# Patient Record
Sex: Female | Born: 2000 | Race: White | Hispanic: No | Marital: Single | Smoking: Never smoker
Health system: Southern US, Community
[De-identification: ages and names within clinical notes are randomized; demographics above are authoritative.]

---
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
## 2019-02-05 NOTE — L&D Delivery Note (Addendum)
Delivery Note Called to bedside, patient complete and pushing. At 1:33 AM a viable female was delivered via Vaginal, Spontaneous (Presentation: Left Occiput Anterior).  Tight nuchal cord noted, delivered through. APGAR: 8, 9; weight 7 lb 11.5 oz (3500 g).   Placenta status: Spontaneous, Intact.  Cord: 3 vessels with the following complications: None.  Anesthesia: None Episiotomy: None Lacerations: 2nd degree;Perineal Suture Repair: 2.0 vicryl Est. Blood Loss (mL): 300  Mom to postpartum.  Baby to Couplet care / Skin to Skin.  Alric Seton 10/19/2019, 3:22 AM

## 2019-03-10 ENCOUNTER — Ambulatory Visit (INDEPENDENT_AMBULATORY_CARE_PROVIDER_SITE_OTHER): Payer: Medicaid Other | Admitting: Adult Health

## 2019-03-10 ENCOUNTER — Other Ambulatory Visit: Payer: Self-pay

## 2019-03-10 ENCOUNTER — Encounter: Payer: Self-pay | Admitting: Adult Health

## 2019-03-10 VITALS — BP 121/69 | HR 87 | Ht 65.5 in | Wt 127.0 lb

## 2019-03-10 DIAGNOSIS — O3680X0 Pregnancy with inconclusive fetal viability, not applicable or unspecified: Secondary | ICD-10-CM | POA: Diagnosis not present

## 2019-03-10 DIAGNOSIS — Z3A01 Less than 8 weeks gestation of pregnancy: Secondary | ICD-10-CM

## 2019-03-10 DIAGNOSIS — Z3201 Encounter for pregnancy test, result positive: Secondary | ICD-10-CM | POA: Diagnosis not present

## 2019-03-10 LAB — POCT URINE PREGNANCY: Preg Test, Ur: POSITIVE — AB

## 2019-03-10 MED ORDER — PRENATAL GUMMIES/DHA & FA 0.4-32.5 MG PO CHEW: 2.0000 | CHEWABLE_TABLET | Freq: Every day | ORAL | Status: DC

## 2019-03-10 NOTE — Progress Notes (Signed)
  Subjective:     Patient ID: Colleen Welch, female   DOB: 30-Jan-2001, 19 y.o.   MRN: [id number]  HPI  Colleen Welch is a 19 year old white female, single in for UPT.Has missed a period and had 3+HPTs.   Review of Systems +missed period, with 3 +HPTs Has had dry heaves in am, esp with brushing her teeth  Reviewed past medical,surgical, social and family history. Reviewed medications and allergies.     Objective:   Physical Exam BP 121/69 (BP Location: Left Arm, Patient Position: Sitting, Cuff Size: Normal)   Pulse 87   Ht 5' 5.5" (1.664 m)   Wt 127 lb (57.6 kg)   LMP 01/27/2019 (Exact Date)   BMI 20.81 kg/m UPT +, about 6 weeks by LMP with EDD 11/03/19. Skin warm and dry. Neck: mid line trachea, normal thyroid, good ROM, no lymphadenopathy noted. Lungs: clear to ausculation bilaterally. Cardiovascular: regular rate and rhythm. Abdomen is soft and non tender.  Fall risk is low PHQ 2 score is 0.     Assessment:     1. Positive urine pregnancy test Eat often   2. Less than [redacted] weeks gestation of pregnancy Continue PN Gummies   3. Encounter to determine fetal viability of pregnancy, single or unspecified fetus Dating [place] in 2 weeks     Plan:     Review handout on First Trimester and by [facility]

## 2019-03-10 NOTE — Patient Instructions (Signed)
First Trimester of Pregnancy The first trimester of pregnancy is from week 1 until the end of week 13 (months 1 through 3). A week after a sperm fertilizes an egg, the egg will implant on the wall of the uterus. This embryo will begin to develop into a baby. Genes from you and your partner will form the baby. The female genes will determine whether the baby will be a boy or a girl. At 6-8 weeks, the eyes and face will be formed, and the heartbeat can be seen on ultrasound. At the end of 12 weeks, all the baby's organs will be formed. Now that you are pregnant, you will want to do everything you can to have a healthy baby. Two of the most important things are to get good prenatal care and to follow your health care provider's instructions. Prenatal care is all the medical care you receive before the baby's birth. This care will help prevent, find, and treat any problems during the pregnancy and childbirth. Body changes during your first trimester Your body goes through many changes during pregnancy. The changes vary from woman to woman.  You may gain or lose a couple of pounds at first.  You may feel sick to your stomach (nauseous) and you may throw up (vomit). If the vomiting is uncontrollable, call your health care provider.  You may tire easily.  You may develop headaches that can be relieved by medicines. All medicines should be approved by your health care provider.  You may urinate more often. Painful urination may mean you have a bladder infection.  You may develop heartburn as a result of your pregnancy.  You may develop constipation because certain hormones are causing the muscles that push stool through your intestines to slow down.  You may develop hemorrhoids or swollen veins (varicose veins).  Your breasts may begin to grow larger and become tender. Your nipples may stick out more, and the tissue that surrounds them (areola) may become darker.  Your gums may bleed and may be  sensitive to brushing and flossing.  Dark spots or blotches (chloasma, mask of pregnancy) may develop on your face. This will likely fade after the baby is born.  Your menstrual periods will stop.  You may have a loss of appetite.  You may develop cravings for certain kinds of food.  You may have changes in your emotions from day to day, such as being excited to be pregnant or being concerned that something may go wrong with the pregnancy and baby.  You may have more vivid and strange dreams.  You may have changes in your hair. These can include thickening of your hair, rapid growth, and changes in texture. Some women also have hair loss during or after pregnancy, or hair that feels dry or thin. Your hair will most likely return to normal after your baby is born. What to expect at prenatal visits During a routine prenatal visit:  You will be weighed to make sure you and the baby are growing normally.  Your blood pressure will be taken.  Your abdomen will be measured to track your baby's growth.  The fetal heartbeat will be listened to between weeks 10 and 14 of your pregnancy.  Test results from any previous visits will be discussed. Your health care provider may ask you:  How you are feeling.  If you are feeling the baby move.  If you have had any abnormal symptoms, such as leaking fluid, bleeding, severe headaches, or abdominal   cramping.  If you are using any tobacco products, including cigarettes, chewing tobacco, and electronic cigarettes.  If you have any questions. Other tests that may be performed during your first trimester include:  Blood tests to find your blood type and to check for the presence of any previous infections. The tests will also be used to check for low iron levels (anemia) and protein on red blood cells (Rh antibodies). Depending on your risk factors, or if you previously had diabetes during pregnancy, you may have tests to check for high blood sugar  that affects pregnant women (gestational diabetes).  Urine tests to check for infections, diabetes, or protein in the urine.  An ultrasound to confirm the proper growth and development of the baby.  Fetal screens for spinal cord problems (spina bifida) and Down syndrome.  HIV (human immunodeficiency virus) testing. Routine prenatal testing includes screening for HIV, unless you choose not to have this test.  You may need other tests to make sure you and the baby are doing well. Follow these instructions at home: Medicines  Follow your health care provider's instructions regarding medicine use. Specific medicines may be either safe or unsafe to take during pregnancy.  Take a prenatal vitamin that contains at least 600 micrograms (mcg) of folic acid.  If you develop constipation, try taking a stool softener if your health care provider approves. Eating and drinking   Eat a balanced diet that includes fresh fruits and vegetables, whole grains, good sources of protein such as meat, eggs, or tofu, and low-fat dairy. Your health care provider will help you determine the amount of weight gain that is right for you.  Avoid raw meat and uncooked cheese. These carry germs that can cause birth defects in the baby.  Eating four or five small meals rather than three large meals a day may help relieve nausea and vomiting. If you start to feel nauseous, eating a few soda crackers can be helpful. Drinking liquids between meals, instead of during meals, also seems to help ease nausea and vomiting.  Limit foods that are high in fat and processed sugars, such as fried and sweet foods.  To prevent constipation: ? Eat foods that are high in fiber, such as fresh fruits and vegetables, whole grains, and beans. ? Drink enough fluid to keep your urine clear or pale yellow. Activity  Exercise only as directed by your health care provider. Most women can continue their usual exercise routine during  pregnancy. Try to exercise for 30 minutes at least 5 days a week. Exercising will help you: ? Control your weight. ? Stay in shape. ? Be prepared for labor and delivery.  Experiencing pain or cramping in the lower abdomen or lower back is a good sign that you should stop exercising. Check with your health care provider before continuing with normal exercises.  Try to avoid standing for long periods of time. Move your legs often if you must stand in one place for a long time.  Avoid heavy lifting.  Wear low-heeled shoes and practice good posture.  You may continue to have sex unless your health care provider tells you not to. Relieving pain and discomfort  Wear a good support bra to relieve breast tenderness.  Take warm sitz baths to soothe any pain or discomfort caused by hemorrhoids. Use hemorrhoid cream if your health care provider approves.  Rest with your legs elevated if you have leg cramps or low back pain.  If you develop varicose veins in   your legs, wear support hose. Elevate your feet for 15 minutes, 3-4 times a day. Limit salt in your diet. Prenatal care  Schedule your prenatal visits by the twelfth week of pregnancy. They are usually scheduled monthly at first, then more often in the last 2 months before delivery.  Write down your questions. Take them to your prenatal visits.  Keep all your prenatal visits as told by your health care provider. This is important. Safety  Wear your seat belt at all times when driving.  Make a list of emergency phone numbers, including numbers for family, friends, the hospital, and police and fire departments. General instructions  Ask your health care provider for a referral to a local prenatal education class. Begin classes no later than the beginning of month 6 of your pregnancy.  Ask for help if you have counseling or nutritional needs during pregnancy. Your health care provider can offer advice or refer you to specialists for help  with various needs.  Do not use hot tubs, steam rooms, or saunas.  Do not douche or use tampons or scented sanitary pads.  Do not cross your legs for long periods of time.  Avoid cat litter boxes and soil used by cats. These carry germs that can cause birth defects in the baby and possibly loss of the fetus by miscarriage or stillbirth.  Avoid all smoking, herbs, alcohol, and medicines not prescribed by your health care provider. Chemicals in these products affect the formation and growth of the baby.  Do not use any products that contain nicotine or tobacco, such as cigarettes and e-cigarettes. If you need help quitting, ask your health care provider. You may receive counseling support and other resources to help you quit.  Schedule a dentist appointment. At home, brush your teeth with a soft toothbrush and be gentle when you floss. Contact a health care provider if:  You have dizziness.  You have mild pelvic cramps, pelvic pressure, or nagging pain in the abdominal area.  You have persistent nausea, vomiting, or diarrhea.  You have a bad smelling vaginal discharge.  You have pain when you urinate.  You notice increased swelling in your face, hands, legs, or ankles.  You are exposed to fifth disease or chickenpox.  You are exposed to German measles (rubella) and have never had it. Get help right away if:  You have a fever.  You are leaking fluid from your vagina.  You have spotting or bleeding from your vagina.  You have severe abdominal cramping or pain.  You have rapid weight gain or loss.  You vomit blood or material that looks like coffee grounds.  You develop a severe headache.  You have shortness of breath.  You have any kind of trauma, such as from a fall or a car accident. Summary  The first trimester of pregnancy is from week 1 until the end of week 13 (months 1 through 3).  Your body goes through many changes during pregnancy. The changes vary from  woman to woman.  You will have routine prenatal visits. During those visits, your health care provider will examine you, discuss any test results you may have, and talk with you about how you are feeling. This information is not intended to replace advice given to you by your health care provider. Make sure you discuss any questions you have with your health care provider. Document Revised: 01/03/2017 Document Reviewed: 01/03/2016 Elsevier Patient Education  2020 [employer]  

## 2019-03-24 ENCOUNTER — Other Ambulatory Visit: Payer: Self-pay

## 2019-03-24 ENCOUNTER — Ambulatory Visit (INDEPENDENT_AMBULATORY_CARE_PROVIDER_SITE_OTHER): Payer: Medicaid Other

## 2019-03-24 DIAGNOSIS — Z3A08 8 weeks gestation of pregnancy: Secondary | ICD-10-CM | POA: Diagnosis not present

## 2019-03-24 DIAGNOSIS — O3680X0 Pregnancy with inconclusive fetal viability, not applicable or unspecified: Secondary | ICD-10-CM | POA: Diagnosis not present

## 2019-03-24 NOTE — Progress Notes (Signed)
[place] 8 wks single IUP with YS,positive fht 169 bpm,normal ovaries,crl 19.83 mm

## 2019-04-20 ENCOUNTER — Other Ambulatory Visit: Payer: Self-pay | Admitting: Obstetrics & Gynecology

## 2019-04-20 DIAGNOSIS — Z3682 Encounter for antenatal screening for nuchal translucency: Secondary | ICD-10-CM

## 2019-04-21 ENCOUNTER — Encounter: Payer: Self-pay | Admitting: Women's Health

## 2019-04-21 ENCOUNTER — Ambulatory Visit (INDEPENDENT_AMBULATORY_CARE_PROVIDER_SITE_OTHER): Payer: Medicaid Other | Admitting: Women's Health

## 2019-04-21 ENCOUNTER — Ambulatory Visit (INDEPENDENT_AMBULATORY_CARE_PROVIDER_SITE_OTHER): Payer: Medicaid Other

## 2019-04-21 ENCOUNTER — Other Ambulatory Visit: Payer: Self-pay

## 2019-04-21 ENCOUNTER — Ambulatory Visit: Payer: Medicaid Other | Admitting: *Deleted

## 2019-04-21 VITALS — BP 123/69 | Wt 141.6 lb

## 2019-04-21 DIAGNOSIS — Z3400 Encounter for supervision of normal first pregnancy, unspecified trimester: Secondary | ICD-10-CM | POA: Insufficient documentation

## 2019-04-21 DIAGNOSIS — Z3A12 12 weeks gestation of pregnancy: Secondary | ICD-10-CM

## 2019-04-21 DIAGNOSIS — Z3401 Encounter for supervision of normal first pregnancy, first trimester: Secondary | ICD-10-CM | POA: Diagnosis not present

## 2019-04-21 DIAGNOSIS — Z331 Pregnant state, incidental: Secondary | ICD-10-CM

## 2019-04-21 DIAGNOSIS — Z3682 Encounter for antenatal screening for nuchal translucency: Secondary | ICD-10-CM | POA: Diagnosis not present

## 2019-04-21 DIAGNOSIS — Z1389 Encounter for screening for other disorder: Secondary | ICD-10-CM

## 2019-04-21 DIAGNOSIS — Z3402 Encounter for supervision of normal first pregnancy, second trimester: Secondary | ICD-10-CM

## 2019-04-21 DIAGNOSIS — Z363 Encounter for antenatal screening for malformations: Secondary | ICD-10-CM

## 2019-04-21 LAB — POCT URINALYSIS DIPSTICK OB
Blood, UA: NEGATIVE
Glucose, UA: NEGATIVE
Ketones, UA: NEGATIVE
Leukocytes, UA: NEGATIVE
Nitrite, UA: NEGATIVE
POC,PROTEIN,UA: NEGATIVE

## 2019-04-21 MED ORDER — BLOOD PRESSURE MONITOR MISC: 0 refills | Status: DC

## 2019-04-21 MED ORDER — DOXYLAMINE-PYRIDOXINE 10-10 MG PO TBEC: DELAYED_RELEASE_TABLET | ORAL | 6 refills | Status: DC

## 2019-04-21 NOTE — Progress Notes (Signed)
[place] 12 wks,measurements c/w dates,crl 66.1 mm,FHR 145 bpm,NB present,NT 2.1 mm

## 2019-04-21 NOTE — Progress Notes (Signed)
INITIAL OBSTETRICAL VISIT Patient name: Colleen Welch MRN [id number]  Date of birth: [birth year] Chief Complaint:   Initial Prenatal Visit  History of Present Illness:   Colleen Welch is a 19 y.o. G1P0 Caucasian female at [redacted]w[redacted]d by LMP c/w 8wk u/s, with an Estimated Date of Delivery: 11/03/19 being seen today for her initial obstetrical visit.   Her obstetrical history is significant for primigravida.   Today she reports nausea, vomited x 2, wants meds.  Patient's last menstrual period was 01/27/2019 (exact date). Last pap <21yo. Results were: n/a Review of Systems:   Pertinent items are noted in HPI Denies cramping/contractions, leakage of fluid, vaginal bleeding, abnormal vaginal discharge w/ itching/odor/irritation, headaches, visual changes, shortness of breath, chest pain, abdominal pain, severe nausea/vomiting, or problems with urination or bowel movements unless otherwise stated above.  Pertinent History Reviewed:  Reviewed past medical,surgical, social, obstetrical and family history.  Reviewed problem list, medications and allergies. OB History  Gravida Para Term Preterm AB Living  1            SAB TAB Ectopic Multiple Live Births               # Outcome Date GA Lbr Len/2nd Weight Sex Delivery Anes PTL Lv  1 Current            Physical Assessment:   Vitals:   04/21/19 1512  BP: 123/69  Weight: 141 lb 9.6 oz (64.2 kg)  Body mass index is 23.21 kg/m.       Physical Examination:  General appearance - well appearing, and in no distress  Mental status - alert, oriented to person, place, and time  Psych:  She has a normal mood and affect  Skin - warm and dry, normal color, no suspicious lesions noted  Chest - effort normal, all lung fields clear to auscultation bilaterally  Heart - normal rate and regular rhythm  Abdomen - soft, nontender  Extremities:  No swelling or varicosities noted  Thin prep pap is not done  TODAY'S NT [place] 12 wks,measurements c/w dates,crl 66.1  mm,FHR 145 bpm,NB present,NT 2.1 mm,normal ovaries   Results for orders placed or performed in visit on 04/21/19 (from the past 24 hour(s))  POC Urinalysis Dipstick OB   Collection Time: 04/21/19  3:06 PM  Result Value Ref Range   Color, UA     Clarity, UA     Glucose, UA Negative Negative   Bilirubin, UA     Ketones, UA n    Spec Grav, UA     Blood, UA n    pH, UA     POC,PROTEIN,UA Negative Negative, Trace, Small (1+), Moderate (2+), Large (3+), 4+   Urobilinogen, UA     Nitrite, UA n    Leukocytes, UA Negative Negative   Appearance     Odor      Assessment & Plan:  1) Low-Risk Pregnancy G1P0 at 102w0d with an Estimated Date of Delivery: 11/03/19   2) Initial OB visit  3) N/V> rx diclegis, gave printed prevention/relief measures   Meds:  Meds ordered this encounter  Medications  . Blood Pressure Monitor MISC    Sig: For regular home bp monitoring during pregnancy    Dispense:  1 each    Refill:  0    Z34.00  . Doxylamine-Pyridoxine (DICLEGIS) 10-10 MG TBEC    Sig: 2 tabs q hs, if sx persist add 1 tab q am on day 3, if sx persist add  1 tab q afternoon on day 4    Dispense:  100 tablet    Refill:  6    Order Specific Question:   Supervising Provider    Answer:   Tania Ade H [[id number]]    Initial labs obtained Continue prenatal vitamins Reviewed n/v relief measures and warning s/s to report Reviewed recommended weight gain based on pre-gravid BMI Encouraged well-balanced diet Genetic Screening discussed: requested nt/it, maternit21 Cystic fibrosis, SMA, Fragile X screening discussed requested Ultrasound discussed; fetal survey: requested CCNC completed>PCM not here, form faxed The nature of [place] for [employer] with multiple MDs and other Advanced Practice Providers was explained to patient; also emphasized that fellows, residents, and students are part of our team. Does not have home bp cuff. Rx faxed to [facility]. Check bp weekly, let us know if  >140/90.   Follow-up: Return in about 6 weeks (around 06/02/2019) for LROB, 2nd IT, [id number], in person, CNM.   Orders Placed This Encounter  Procedures  . Urine Culture  . GC/Chlamydia Probe Amp  . US OB Comp + 14 Wk  . Pain Management Screening Profile (10S)  . Hepatitis C antibody  . Obstetric Panel, Including HIV  . Hgb Fractionation Cascade  . Integrated 1  . POC Urinalysis Dipstick OB    Roma Schanz CNM, [facility] 04/21/2019 4:09 PM

## 2019-04-21 NOTE — Patient Instructions (Signed)
[name], I greatly value your feedback.  If you receive a survey following your visit with [place] today, we appreciate you taking the time to fill it out.  Thanks, Joellyn Haff, CNM, [facility]  [facility] HAS MOVED!!! It is now [facility] ([address] [place], [place] [address]) Entrance located off of [address] 24/7 valet parking   Nausea & Vomiting  Have saltine crackers or pretzels by your bed and eat a few bites before you raise your head out of bed in the morning  Eat small frequent meals throughout the day instead of large meals  Drink plenty of fluids throughout the day to stay hydrated, just don't drink a lot of fluids with your meals.  This can make your stomach fill up faster making you feel sick  Do not brush your teeth right after you eat  Products with real ginger are good for nausea, like ginger ale and ginger hard candy Make sure it says made with real ginger!  Sucking on sour candy like lemon heads is also good for nausea  If your prenatal vitamins make you nauseated, take them at night so you will sleep through the nausea  Sea Bands  If you feel like you need medicine for the nausea & vomiting please let us know  If you are unable to keep any fluids or food down please let us know   Constipation  Drink plenty of fluid, preferably water, throughout the day  Eat foods high in fiber such as fruits, vegetables, and grains  Exercise, such as walking, is a good way to keep your bowels regular  Drink warm fluids, especially warm prune juice, or decaf coffee  Eat a 1/2 cup of real oatmeal (not instant), 1/2 cup applesauce, and 1/2-1 cup warm prune juice every day  If needed, you may take Colace (docusate sodium) stool softener once or twice a day to help keep the stool soft.   If you still are having problems with constipation, you may take Miralax once daily as needed to help keep your bowels regular.   Home Blood  Pressure Monitoring for Patients   Your provider has recommended that you check your blood pressure (BP) at least once a week at home. If you do not have a blood pressure cuff at home, one will be provided for you. Contact your provider if you have not received your monitor within 1 week.   Helpful Tips for Accurate Home Blood Pressure Checks  . Don't smoke, exercise, or drink caffeine 30 minutes before checking your BP . Use the restroom before checking your BP (a full bladder can raise your pressure) . Relax in a comfortable upright chair . Feet on the ground . Left arm resting comfortably on a flat surface at the level of your heart . Legs uncrossed . Back supported . Sit quietly and don't talk . Place the cuff on your bare arm . Adjust snuggly, so that only two fingertips can fit between your skin and the top of the cuff . Check 2 readings separated by at least one minute . Keep a log of your BP readings . For a visual, please reference this diagram: http://ccnc.care/bpdiagram  Provider Name: [facility]     Phone: [phone]  Zone 1: ALL CLEAR  Continue to monitor your symptoms:  . BP reading is less than 140 (top number) or less than 90 (bottom number)  . No right upper stomach  pain . No headaches or seeing spots . No feeling nauseated or throwing up . No swelling in face and hands  Zone 2: CAUTION Call your doctor's office for any of the following:  . BP reading is greater than 140 (top number) or greater than 90 (bottom number)  . Stomach pain under your ribs in the middle or right side . Headaches or seeing spots . Feeling nauseated or throwing up . Swelling in face and hands  Zone 3: EMERGENCY  Seek immediate medical care if you have any of the following:  . BP reading is greater than160 (top number) or greater than 110 (bottom number) . Severe headaches not improving with Tylenol . Serious difficulty catching your breath . Any worsening symptoms from Zone  2    First Trimester of Pregnancy The first trimester of pregnancy is from week 1 until the end of week 12 (months 1 through 3). A week after a sperm fertilizes an egg, the egg will implant on the wall of the uterus. This embryo will begin to develop into a baby. Genes from you and your partner are forming the baby. The female genes determine whether the baby is a boy or a girl. At 6-8 weeks, the eyes and face are formed, and the heartbeat can be seen on ultrasound. At the end of 12 weeks, all the baby's organs are formed.  Now that you are pregnant, you will want to do everything you can to have a healthy baby. Two of the most important things are to get good prenatal care and to follow your health care provider's instructions. Prenatal care is all the medical care you receive before the baby's birth. This care will help prevent, find, and treat any problems during the pregnancy and childbirth. BODY CHANGES Your body goes through many changes during pregnancy. The changes vary from woman to woman.   You may gain or lose a couple of pounds at first.  You may feel sick to your stomach (nauseous) and throw up (vomit). If the vomiting is uncontrollable, call your health care provider.  You may tire easily.  You may develop headaches that can be relieved by medicines approved by your health care provider.  You may urinate more often. Painful urination may mean you have a bladder infection.  You may develop heartburn as a result of your pregnancy.  You may develop constipation because certain hormones are causing the muscles that push waste through your intestines to slow down.  You may develop hemorrhoids or swollen, bulging veins (varicose veins).  Your breasts may begin to grow larger and become tender. Your nipples may stick out more, and the tissue that surrounds them (areola) may become darker.  Your gums may bleed and may be sensitive to brushing and flossing.  Dark spots or blotches  (chloasma, mask of pregnancy) may develop on your face. This will likely fade after the baby is born.  Your menstrual periods will stop.  You may have a loss of appetite.  You may develop cravings for certain kinds of food.  You may have changes in your emotions from day to day, such as being excited to be pregnant or being concerned that something may go wrong with the pregnancy and baby.  You may have more vivid and strange dreams.  You may have changes in your hair. These can include thickening of your hair, rapid growth, and changes in texture. Some women also have hair loss during or after pregnancy, or hair that  feels dry or thin. Your hair will most likely return to normal after your baby is born. WHAT TO EXPECT AT YOUR PRENATAL VISITS During a routine prenatal visit:  You will be weighed to make sure you and the baby are growing normally.  Your blood pressure will be taken.  Your abdomen will be measured to track your baby's growth.  The fetal heartbeat will be listened to starting around week 10 or 12 of your pregnancy.  Test results from any previous visits will be discussed. Your health care provider may ask you:  How you are feeling.  If you are feeling the baby move.  If you have had any abnormal symptoms, such as leaking fluid, bleeding, severe headaches, or abdominal cramping.  If you have any questions. Other tests that may be performed during your first trimester include:  Blood tests to find your blood type and to check for the presence of any previous infections. They will also be used to check for low iron levels (anemia) and Rh antibodies. Later in the pregnancy, blood tests for diabetes will be done along with other tests if problems develop.  Urine tests to check for infections, diabetes, or protein in the urine.  An ultrasound to confirm the proper growth and development of the baby.  An amniocentesis to check for possible genetic problems.  Fetal  screens for spina bifida and Down syndrome.  You may need other tests to make sure you and the baby are doing well. HOME CARE INSTRUCTIONS  Medicines  Follow your health care provider's instructions regarding medicine use. Specific medicines may be either safe or unsafe to take during pregnancy.  Take your prenatal vitamins as directed.  If you develop constipation, try taking a stool softener if your health care provider approves. Diet  Eat regular, well-balanced meals. Choose a variety of foods, such as meat or vegetable-based protein, fish, milk and low-fat dairy products, vegetables, fruits, and whole grain breads and cereals. Your health care provider will help you determine the amount of weight gain that is right for you.  Avoid raw meat and uncooked cheese. These carry germs that can cause birth defects in the baby.  Eating four or five small meals rather than three large meals a day may help relieve nausea and vomiting. If you start to feel nauseous, eating a few soda crackers can be helpful. Drinking liquids between meals instead of during meals also seems to help nausea and vomiting.  If you develop constipation, eat more high-fiber foods, such as fresh vegetables or fruit and whole grains. Drink enough fluids to keep your urine clear or pale yellow. Activity and Exercise  Exercise only as directed by your health care provider. Exercising will help you:  Control your weight.  Stay in shape.  Be prepared for labor and delivery.  Experiencing pain or cramping in the lower abdomen or low back is a good sign that you should stop exercising. Check with your health care provider before continuing normal exercises.  Try to avoid standing for long periods of time. Move your legs often if you must stand in one place for a long time.  Avoid heavy lifting.  Wear low-heeled shoes, and practice good posture.  You may continue to have sex unless your health care provider directs you  otherwise. Relief of Pain or Discomfort  Wear a good support bra for breast tenderness.    Take warm sitz baths to soothe any pain or discomfort caused by hemorrhoids. Use hemorrhoid cream  if your health care provider approves.    Rest with your legs elevated if you have leg cramps or low back pain.  If you develop varicose veins in your legs, wear support hose. Elevate your feet for 15 minutes, 3-4 times a day. Limit salt in your diet. Prenatal Care  Schedule your prenatal visits by the twelfth week of pregnancy. They are usually scheduled monthly at first, then more often in the last 2 months before delivery.  Write down your questions. Take them to your prenatal visits.  Keep all your prenatal visits as directed by your health care provider. Safety  Wear your seat belt at all times when driving.  Make a list of emergency phone numbers, including numbers for family, friends, the hospital, and police and fire departments. General Tips  Ask your health care provider for a referral to a local prenatal education class. Begin classes no later than at the beginning of month 6 of your pregnancy.  Ask for help if you have counseling or nutritional needs during pregnancy. Your health care provider can offer advice or refer you to specialists for help with various needs.  Do not use hot tubs, steam rooms, or saunas.  Do not douche or use tampons or scented sanitary pads.  Do not cross your legs for long periods of time.  Avoid cat litter boxes and soil used by cats. These carry germs that can cause birth defects in the baby and possibly loss of the fetus by miscarriage or stillbirth.  Avoid all smoking, herbs, alcohol, and medicines not prescribed by your health care provider. Chemicals in these affect the formation and growth of the baby.  Schedule a dentist appointment. At home, brush your teeth with a soft toothbrush and be gentle when you floss. SEEK MEDICAL CARE IF:   You have  dizziness.  You have mild pelvic cramps, pelvic pressure, or nagging pain in the abdominal area.  You have persistent nausea, vomiting, or diarrhea.  You have a bad smelling vaginal discharge.  You have pain with urination.  You notice increased swelling in your face, hands, legs, or ankles. SEEK IMMEDIATE MEDICAL CARE IF:   You have a fever.  You are leaking fluid from your vagina.  You have spotting or bleeding from your vagina.  You have severe abdominal cramping or pain.  You have rapid weight gain or loss.  You vomit blood or material that looks like coffee grounds.  You are exposed to [place] measles and have never had them.  You are exposed to fifth disease or chickenpox.  You develop a severe headache.  You have shortness of breath.  You have any kind of trauma, such as from a fall or a car accident. Document Released: 01/15/2001 Document Revised: 06/07/2013 Document Reviewed: 12/01/2012 [facility] Patient Information 2015 [place], [place]. This information is not intended to replace advice given to you by your health care provider. Make sure you discuss any questions you have with your health care provider.  Coronavirus (COVID-19) Are you at risk?  Are you at risk for the Coronavirus (COVID-19)?  To be considered HIGH RISK for Coronavirus (COVID-19), you have to meet the following criteria:  . Traveled to [place], [place], [place], [place] or [place]; or in the [place] to [place], [place], [place], or [place]; and have fever, cough, and shortness of breath within the last 2 weeks of travel OR . Been in close contact with a person diagnosed with COVID-19 within the last 2  weeks and have fever, cough, and shortness of breath . IF YOU DO NOT MEET THESE CRITERIA, YOU ARE CONSIDERED LOW RISK FOR COVID-19.  What to do if you are HIGH RISK for COVID-19?  Marland Kitchen If you are having a medical emergency, call 911. . Seek medical care right away. Before you go to a  doctor's office, urgent care or emergency department, call ahead and tell them about your recent travel, contact with someone diagnosed with COVID-19, and your symptoms. You should receive instructions from your physician's office regarding next steps of care.  . When you arrive at healthcare provider, tell the healthcare staff immediately you have returned from visiting [place], [place], [place], [place] or [place]; or traveled in the [place] to [place], [place], [place], or [place]; in the last two weeks or you have been in close contact with a person diagnosed with COVID-19 in the last 2 weeks.   . Tell the health care staff about your symptoms: fever, cough and shortness of breath. . After you have been seen by a medical provider, you will be either: o Tested for (COVID-19) and discharged home on quarantine except to seek medical care if symptoms worsen, and asked to  - Stay home and avoid contact with others until you get your results (4-5 days)  - Avoid travel on public transportation if possible (such as bus, train, or airplane) or o Sent to the Emergency Department by EMS for evaluation, COVID-19 testing, and possible admission depending on your condition and test results.  What to do if you are LOW RISK for COVID-19?  Reduce your risk of any infection by using the same precautions used for avoiding the common cold or flu:  Marland Kitchen Wash your hands often with soap and warm water for at least 20 seconds.  If soap and water are not readily available, use an alcohol-based hand sanitizer with at least 60% alcohol.  . If coughing or sneezing, cover your mouth and nose by coughing or sneezing into the elbow areas of your shirt or coat, into a tissue or into your sleeve (not your hands). . Avoid shaking hands with others and consider head nods or verbal greetings only. . Avoid touching your eyes, nose, or mouth with unwashed hands.  . Avoid close contact with people who are sick. . Avoid  places or events with large numbers of people in one location, like concerts or sporting events. . Carefully consider travel plans you have or are making. . If you are planning any travel outside or inside the [place], visit the CDC's Travelers' Health webpage for the latest health notices. . If you have some symptoms but not all symptoms, continue to monitor at home and seek medical attention if your symptoms worsen. . If you are having a medical emergency, call 911.   [place] / e-Visit: [email or web address]         [facility]: [facility]: [id number]                   [facility]: [phone]

## 2019-04-22 LAB — PMP SCREEN PROFILE (10S), URINE
Amphetamine Scrn, Ur: NEGATIVE ng/mL
BARBITURATE SCREEN URINE: NEGATIVE ng/mL
BENZODIAZEPINE SCREEN, URINE: NEGATIVE ng/mL
CANNABINOIDS UR QL SCN: POSITIVE ng/mL — AB
Cocaine (Metab) Scrn, Ur: NEGATIVE ng/mL
Creatinine(Crt), U: 74.5 mg/dL (ref 20.0–300.0)
Methadone Screen, Urine: NEGATIVE ng/mL
OXYCODONE+OXYMORPHONE UR QL SCN: NEGATIVE ng/mL
Opiate Scrn, Ur: NEGATIVE ng/mL
Ph of Urine: 6.1 (ref 4.5–8.9)
Phencyclidine Qn, Ur: NEGATIVE ng/mL
Propoxyphene Scrn, Ur: NEGATIVE ng/mL

## 2019-04-23 ENCOUNTER — Encounter: Payer: Self-pay | Admitting: Women's Health

## 2019-04-23 DIAGNOSIS — Z283 Underimmunization status: Secondary | ICD-10-CM | POA: Insufficient documentation

## 2019-04-23 DIAGNOSIS — O09899 Supervision of other high risk pregnancies, unspecified trimester: Secondary | ICD-10-CM | POA: Insufficient documentation

## 2019-04-23 DIAGNOSIS — F1290 Cannabis use, unspecified, uncomplicated: Secondary | ICD-10-CM | POA: Insufficient documentation

## 2019-04-23 LAB — INTEGRATED 1
Crown Rump Length: 66 mm
Gest. Age on Collection Date: 12.7 weeks
Maternal Age at EDD: 20.6 yr
Nuchal Translucency (NT): 2.1 mm
Number of Fetuses: 1
PAPP-A Value: 955.5 ng/mL
Weight: 142 [lb_av]

## 2019-04-23 LAB — URINE CULTURE

## 2019-04-24 LAB — HEPATITIS C ANTIBODY: Hep C Virus Ab: <0.1 s/co ratio (ref 0.0–0.9)

## 2019-04-24 LAB — HGB FRACTIONATION CASCADE
Hgb A2: 2.9 % (ref 1.8–3.2)
Hgb A: 96.7 % (ref 96.4–98.8)
Hgb F: 0.4 % (ref 0.0–2.0)
Hgb S: 0 %

## 2019-04-24 LAB — OBSTETRIC PANEL, INCLUDING HIV
Antibody Screen: NEGATIVE
Basophils Absolute: 0.1 10*3/uL (ref 0.0–0.2)
Basos: 1 %
EOS (ABSOLUTE): 0.2 10*3/uL (ref 0.0–0.4)
Eos: 2 %
HIV Screen 4th Generation wRfx: NONREACTIVE
Hematocrit: 35.5 % (ref 34.0–46.6)
Hemoglobin: 12.1 g/dL (ref 11.1–15.9)
Hepatitis B Surface Ag: NEGATIVE
Immature Grans (Abs): 0 10*3/uL (ref 0.0–0.1)
Immature Granulocytes: 0 %
Lymphocytes Absolute: 2.9 10*3/uL (ref 0.7–3.1)
Lymphs: 21 %
MCH: 30.2 pg (ref 26.6–33.0)
MCHC: 34.1 g/dL (ref 31.5–35.7)
MCV: 89 fL (ref 79–97)
Monocytes Absolute: 0.7 10*3/uL (ref 0.1–0.9)
Monocytes: 5 %
Neutrophils Absolute: 10.1 10*3/uL — ABNORMAL HIGH (ref 1.4–7.0)
Neutrophils: 71 %
Platelets: 360 10*3/uL (ref 150–450)
RBC: 4.01 x10E6/uL (ref 3.77–5.28)
RDW: 12.2 % (ref 11.7–15.4)
RPR Ser Ql: NONREACTIVE
Rh Factor: NEGATIVE
Rubella Antibodies, IGG: <0.9 index — ABNORMAL LOW (ref >0.99)
WBC: 14 10*3/uL — ABNORMAL HIGH (ref 3.4–10.8)

## 2019-04-24 LAB — GC/CHLAMYDIA PROBE AMP
Chlamydia trachomatis, NAA: NEGATIVE
Neisseria Gonorrhoeae by PCR: NEGATIVE

## 2019-05-06 ENCOUNTER — Other Ambulatory Visit: Payer: Self-pay | Admitting: *Deleted

## 2019-05-06 DIAGNOSIS — Z3A14 14 weeks gestation of pregnancy: Secondary | ICD-10-CM

## 2019-05-10 LAB — MATERNIT 21 PLUS CORE, BLOOD
Fetal Fraction: 9
Result (T21): NEGATIVE
Trisomy 13 (Patau syndrome): NEGATIVE
Trisomy 18 (Edwards syndrome): NEGATIVE
Trisomy 21 (Down syndrome): NEGATIVE

## 2019-06-02 ENCOUNTER — Encounter: Payer: Self-pay | Admitting: Advanced Practice Midwife

## 2019-06-02 ENCOUNTER — Ambulatory Visit (INDEPENDENT_AMBULATORY_CARE_PROVIDER_SITE_OTHER): Payer: Medicaid Other

## 2019-06-02 ENCOUNTER — Ambulatory Visit (INDEPENDENT_AMBULATORY_CARE_PROVIDER_SITE_OTHER): Payer: Medicaid Other | Admitting: Advanced Practice Midwife

## 2019-06-02 ENCOUNTER — Other Ambulatory Visit: Payer: Self-pay

## 2019-06-02 VITALS — BP 125/74 | HR 84 | Wt 145.0 lb

## 2019-06-02 DIAGNOSIS — Z1389 Encounter for screening for other disorder: Secondary | ICD-10-CM

## 2019-06-02 DIAGNOSIS — Z363 Encounter for antenatal screening for malformations: Secondary | ICD-10-CM | POA: Diagnosis not present

## 2019-06-02 DIAGNOSIS — Z3401 Encounter for supervision of normal first pregnancy, first trimester: Secondary | ICD-10-CM

## 2019-06-02 DIAGNOSIS — Z1379 Encounter for other screening for genetic and chromosomal anomalies: Secondary | ICD-10-CM

## 2019-06-02 DIAGNOSIS — Z3A18 18 weeks gestation of pregnancy: Secondary | ICD-10-CM

## 2019-06-02 DIAGNOSIS — O321XX0 Maternal care for breech presentation, not applicable or unspecified: Secondary | ICD-10-CM

## 2019-06-02 DIAGNOSIS — Z331 Pregnant state, incidental: Secondary | ICD-10-CM

## 2019-06-02 DIAGNOSIS — Z3402 Encounter for supervision of normal first pregnancy, second trimester: Secondary | ICD-10-CM

## 2019-06-02 DIAGNOSIS — O26899 Other specified pregnancy related conditions, unspecified trimester: Secondary | ICD-10-CM | POA: Insufficient documentation

## 2019-06-02 DIAGNOSIS — Z6791 Unspecified blood type, Rh negative: Secondary | ICD-10-CM

## 2019-06-02 LAB — POCT URINALYSIS DIPSTICK OB
Glucose, UA: NEGATIVE
Ketones, UA: NEGATIVE
Leukocytes, UA: NEGATIVE
Nitrite, UA: NEGATIVE

## 2019-06-02 NOTE — Patient Instructions (Signed)
[name], I greatly value your feedback.  If you receive a survey following your visit with [place] today, we appreciate you taking the time to fill it out.  Thanks, Philipp Deputy, CNM  [facility] ([address] [place], [place] [address]) Entrance C, located off of [address] valet parking  Go to [email or web address] to register for FREE online childbirth classes  [place] Pediatricians/Family Doctors:  [facility] [phone]            [facility] [phone]                 [facility] [phone] (usually not accepting new patients unless you have family there already, you are always welcome to call and ask)       [facility] Department [phone]       [facility] Pediatricians/Family Doctors:   [facility]: [phone]  [facility]: [phone]  [facility]: [phone]  [facility] Doctors:   [facility]: [phone]   [facility]: [phone]  [facility] Doctors:  [facility]: [phone]    Home Blood Pressure Monitoring for Patients   Your provider has recommended that you check your blood pressure (BP) at least once a week at home. If you do not have a blood pressure cuff at home, one will be provided for you. Contact your provider if you have not received your monitor within 1 week.   Helpful Tips for Accurate Home Blood Pressure Checks  . Don't smoke, exercise, or drink caffeine 30 minutes before checking your BP . Use the restroom before checking your BP (a full bladder can raise your pressure) . Relax in a comfortable upright chair . Feet on the ground . Left arm resting comfortably on a flat surface at the level of your heart . Legs uncrossed . Back supported . Sit quietly and don't talk . Place the cuff on your bare arm . Adjust snuggly, so that only  two fingertips can fit between your skin and the top of the cuff . Check 2 readings separated by at least one minute . Keep a log of your BP readings . For a visual, please reference this diagram: http://ccnc.care/bpdiagram  Provider Name: [facility]     Phone: [phone]  Zone 1: ALL CLEAR  Continue to monitor your symptoms:  . BP reading is less than 140 (top number) or less than 90 (bottom number)  . No right upper stomach pain . No headaches or seeing spots . No feeling nauseated or throwing up . No swelling in face and hands  Zone 2: CAUTION Call your doctor's office for any of the following:  . BP reading is greater than 140 (top number) or greater than 90 (bottom number)  . Stomach pain under your ribs in the middle or right side . Headaches or seeing spots . Feeling nauseated or throwing up . Swelling in face and hands  Zone 3: EMERGENCY  Seek immediate medical care if you have any of the following:  . BP reading is greater than160 (top number) or greater than 110 (bottom number) . Severe headaches not improving with Tylenol . Serious difficulty catching your breath . Any worsening symptoms from Zone 2     Second Trimester of Pregnancy The second trimester is from week 14 through week 27 (months 4 through 6). The second trimester is often a time when you feel your best.  Your body has adjusted to being pregnant, and you begin to feel better physically. Usually, morning sickness has lessened or quit completely, you may have more energy, and you may have an increase in appetite. The second trimester is also a time when the fetus is growing rapidly. At the end of the sixth month, the fetus is about 9 inches long and weighs about 1 pounds. You will likely begin to feel the baby move (quickening) between 16 and 20 weeks of pregnancy. Body changes during your second trimester Your body continues to go through many changes during your second trimester. The changes vary  from woman to woman.  Your weight will continue to increase. You will notice your lower abdomen bulging out.  You may begin to get stretch marks on your hips, abdomen, and breasts.  You may develop headaches that can be relieved by medicines. The medicines should be approved by your health care provider.  You may urinate more often because the fetus is pressing on your bladder.  You may develop or continue to have heartburn as a result of your pregnancy.  You may develop constipation because certain hormones are causing the muscles that push waste through your intestines to slow down.  You may develop hemorrhoids or swollen, bulging veins (varicose veins).  You may have back pain. This is caused by: ? Weight gain. ? Pregnancy hormones that are relaxing the joints in your pelvis. ? A shift in weight and the muscles that support your balance.  Your breasts will continue to grow and they will continue to become tender.  Your gums may bleed and may be sensitive to brushing and flossing.  Dark spots or blotches (chloasma, mask of pregnancy) may develop on your face. This will likely fade after the baby is born.  A dark line from your belly button to the pubic area (linea nigra) may appear. This will likely fade after the baby is born.  You may have changes in your hair. These can include thickening of your hair, rapid growth, and changes in texture. Some women also have hair loss during or after pregnancy, or hair that feels dry or thin. Your hair will most likely return to normal after your baby is born.  What to expect at prenatal visits During a routine prenatal visit:  You will be weighed to make sure you and the fetus are growing normally.  Your blood pressure will be taken.  Your abdomen will be measured to track your baby's growth.  The fetal heartbeat will be listened to.  Any test results from the previous visit will be discussed.  Your health care provider may ask  you:  How you are feeling.  If you are feeling the baby move.  If you have had any abnormal symptoms, such as leaking fluid, bleeding, severe headaches, or abdominal cramping.  If you are using any tobacco products, including cigarettes, chewing tobacco, and electronic cigarettes.  If you have any questions.  Other tests that may be performed during your second trimester include:  Blood tests that check for: ? Low iron levels (anemia). ? High blood sugar that affects pregnant women (gestational diabetes) between 14 and 28 weeks. ? Rh antibodies. This is to check for a protein on red blood cells (Rh factor).  Urine tests to check for infections, diabetes, or protein in the urine.  An ultrasound to confirm the proper growth and development of the baby.  An amniocentesis to check for possible genetic problems.  Fetal screens  for spina bifida and Down syndrome.  HIV (human immunodeficiency virus) testing. Routine prenatal testing includes screening for HIV, unless you choose not to have this test.  Follow these instructions at home: Medicines  Follow your health care provider's instructions regarding medicine use. Specific medicines may be either safe or unsafe to take during pregnancy.  Take a prenatal vitamin that contains at least 600 micrograms (mcg) of folic acid.  If you develop constipation, try taking a stool softener if your health care provider approves. Eating and drinking  Eat a balanced diet that includes fresh fruits and vegetables, whole grains, good sources of protein such as meat, eggs, or tofu, and low-fat dairy. Your health care provider will help you determine the amount of weight gain that is right for you.  Avoid raw meat and uncooked cheese. These carry germs that can cause birth defects in the baby.  If you have low calcium intake from food, talk to your health care provider about whether you should take a daily calcium supplement.  Limit foods that  are high in fat and processed sugars, such as fried and sweet foods.  To prevent constipation: ? Drink enough fluid to keep your urine clear or pale yellow. ? Eat foods that are high in fiber, such as fresh fruits and vegetables, whole grains, and beans. Activity  Exercise only as directed by your health care provider. Most women can continue their usual exercise routine during pregnancy. Try to exercise for 30 minutes at least 5 days a week. Stop exercising if you experience uterine contractions.  Avoid heavy lifting, wear low heel shoes, and practice good posture.  A sexual relationship may be continued unless your health care provider directs you otherwise. Relieving pain and discomfort  Wear a good support bra to prevent discomfort from breast tenderness.  Take warm sitz baths to soothe any pain or discomfort caused by hemorrhoids. Use hemorrhoid cream if your health care provider approves.  Rest with your legs elevated if you have leg cramps or low back pain.  If you develop varicose veins, wear support hose. Elevate your feet for 15 minutes, 3-4 times a day. Limit salt in your diet. Prenatal Care  Write down your questions. Take them to your prenatal visits.  Keep all your prenatal visits as told by your health care provider. This is important. Safety  Wear your seat belt at all times when driving.  Make a list of emergency phone numbers, including numbers for family, friends, the hospital, and police and fire departments. General instructions  Ask your health care provider for a referral to a local prenatal education class. Begin classes no later than the beginning of month 6 of your pregnancy.  Ask for help if you have counseling or nutritional needs during pregnancy. Your health care provider can offer advice or refer you to specialists for help with various needs.  Do not use hot tubs, steam rooms, or saunas.  Do not douche or use tampons or scented sanitary  pads.  Do not cross your legs for long periods of time.  Avoid cat litter boxes and soil used by cats. These carry germs that can cause birth defects in the baby and possibly loss of the fetus by miscarriage or stillbirth.  Avoid all smoking, herbs, alcohol, and unprescribed drugs. Chemicals in these products can affect the formation and growth of the baby.  Do not use any products that contain nicotine or tobacco, such as cigarettes and e-cigarettes. If you need help quitting,  ask your health care provider.  Visit your dentist if you have not gone yet during your pregnancy. Use a soft toothbrush to brush your teeth and be gentle when you floss. Contact a health care provider if:  You have dizziness.  You have mild pelvic cramps, pelvic pressure, or nagging pain in the abdominal area.  You have persistent nausea, vomiting, or diarrhea.  You have a bad smelling vaginal discharge.  You have pain when you urinate. Get help right away if:  You have a fever.  You are leaking fluid from your vagina.  You have spotting or bleeding from your vagina.  You have severe abdominal cramping or pain.  You have rapid weight gain or weight loss.  You have shortness of breath with chest pain.  You notice sudden or extreme swelling of your face, hands, ankles, feet, or legs.  You have not felt your baby move in over an hour.  You have severe headaches that do not go away when you take medicine.  You have vision changes. Summary  The second trimester is from week 14 through week 27 (months 4 through 6). It is also a time when the fetus is growing rapidly.  Your body goes through many changes during pregnancy. The changes vary from woman to woman.  Avoid all smoking, herbs, alcohol, and unprescribed drugs. These chemicals affect the formation and growth your baby.  Do not use any tobacco products, such as cigarettes, chewing tobacco, and e-cigarettes. If you need help quitting, ask your  health care provider.  Contact your health care provider if you have any questions. Keep all prenatal visits as told by your health care provider. This is important. This information is not intended to replace advice given to you by your health care provider. Make sure you discuss any questions you have with your health care provider. Document Released: 01/15/2001 Document Revised: 06/29/2015 Document Reviewed: 03/24/2012 Elsevier Interactive Patient Education  2017 [place] FLU! Because you are pregnant, we at [facility], along with the Centers for Disease Control (CDC), recommend that you receive the flu vaccine to protect yourself and your baby from the flu. The flu is more likely to cause severe illness in pregnant women than in women of reproductive age who are not pregnant. Changes in the immune system, heart, and lungs during pregnancy make pregnant women (and women up to two weeks postpartum) more prone to severe illness from flu, including illness resulting in hospitalization. Flu also may be harmful for a pregnant woman's developing baby. A common flu symptom is fever, which may be associated with neural tube defects and other adverse outcomes for a developing baby. Getting vaccinated can also help protect a baby after birth from flu. (Mom passes antibodies onto the developing baby during her pregnancy.)  A Flu Vaccine is the Best Protection Against Flu Getting a flu vaccine is the first and most important step in protecting against flu. Pregnant women should get a flu shot and not the live attenuated influenza vaccine (LAIV), also known as nasal spray flu vaccine. Flu vaccines given during pregnancy help protect both the mother and her baby from flu. Vaccination has been shown to reduce the risk of flu-associated acute respiratory infection in pregnant women by up to one-half. A 2018 study showed that getting a flu shot reduced a pregnant woman's  risk of being hospitalized with flu by an average of 40 percent. Pregnant women who get a flu  vaccine are also helping to protect their babies from flu illness for the first several months after their birth, when they are too young to get vaccinated.   A Long Record of Safety for Flu Shots in Pregnant Women Flu shots have been given to millions of pregnant women over many years with a good safety record. There is a lot of evidence that flu vaccines can be given safely during pregnancy; though these data are limited for the first trimester. The CDC recommends that pregnant women get vaccinated during any trimester of their pregnancy. It is very important for pregnant women to get the flu shot.   Other Preventive Actions In addition to getting a flu shot, pregnant women should take the same everyday preventive actions the CDC recommends of everyone, including covering coughs, washing hands often, and avoiding people who are sick.  Symptoms and Treatment If you get sick with flu symptoms call your doctor right away. There are antiviral drugs that can treat flu illness and prevent serious flu complications. The CDC recommends prompt treatment for people who have influenza infection or suspected influenza infection and who are at high risk of serious flu complications, such as people with asthma, diabetes (including gestational diabetes), or heart disease. Early treatment of influenza in hospitalized pregnant women has been shown to reduce the length of the hospital stay.  Symptoms Flu symptoms include fever, cough, sore throat, runny or stuffy nose, body aches, headache, chills and fatigue. Some people may also have vomiting and diarrhea. People may be infected with the flu and have respiratory symptoms without a fever.  Early Treatment is Important for Pregnant Women Treatment should begin as soon as possible because antiviral drugs work best when started early (within 48 hours after symptoms  start). Antiviral drugs can make your flu illness milder and make you feel better faster. They may also prevent serious health problems that can result from flu illness. Oral oseltamivir (Tamiflu) is the preferred treatment for pregnant women because it has the most studies available to suggest that it is safe and beneficial. Antiviral drugs require a prescription from your provider. Having a fever caused by flu infection or other infections early in pregnancy may be linked to birth defects in a baby. In addition to taking antiviral drugs, pregnant women who get a fever should treat their fever with Tylenol (acetaminophen) and contact their provider immediately.  When to [place] If you are pregnant and have any of these signs, seek care immediately:  Difficulty breathing or shortness of breath  Pain or pressure in the chest or abdomen  Sudden dizziness  Confusion  Severe or persistent vomiting  High fever that is not responding to Tylenol (or store brand equivalent)  Decreased or no movement of your baby  [employer][email or web address]

## 2019-06-02 NOTE — Progress Notes (Addendum)
   LOW-RISK PREGNANCY VISIT Patient name: Colleen Welch MRN [id number]  Date of birth: 12-09-2000 Chief Complaint:   Routine Prenatal Visit ([place] & 2nd IT)  History of Present Illness:   Colleen Welch is a 19 y.o. G1P0 female at [redacted]w[redacted]d with an Estimated Date of Delivery: 11/03/19 being seen today for ongoing management of a low-risk pregnancy.  Today she reports doing well; denies dysuria/frequency. Contractions: Not present. Vag. Bleeding: None.  Movement: Absent. denies leaking of fluid. Review of Systems:   Pertinent items are noted in HPI Denies abnormal vaginal discharge w/ itching/odor/irritation, headaches, visual changes, shortness of breath, chest pain, abdominal pain, severe nausea/vomiting, or problems with urination or bowel movements unless otherwise stated above. Pertinent History Reviewed:  Reviewed past medical,surgical, social, obstetrical and family history.  Reviewed problem list, medications and allergies. Physical Assessment:   Vitals:   06/02/19 1609  BP: 125/74  Pulse: 84  Weight: 145 lb (65.8 kg)  Body mass index is 23.76 kg/m.        Physical Examination:   General appearance: Well appearing, and in no distress  Mental status: Alert, oriented to person, place, and time  Skin: Warm & dry  Cardiovascular: Normal heart rate noted  Respiratory: Normal respiratory effort, no distress  Abdomen: Soft, gravid, nontender  Pelvic: Cervical exam deferred         Extremities: Edema: None  Fetal Status: Fetal Heart Rate (bpm): 148 u/s   Movement: Absent     Anatomy u/s: [place] 18 wks,breech,posterior placenta gr 0,normal ovaries,cx 3.2 cm,bilat renal pelvic dilatation,RK 4 mm,LK  5.3 mm,fhr 148 bpm,svp of fluid 4.8 cm,EFW 287 g 99%,AC 97%,anatomy complete  Results for orders placed or performed in visit on 06/02/19 (from the past 24 hour(s))  POC Urinalysis Dipstick OB   Collection Time: 06/02/19  4:09 PM  Result Value Ref Range   Color, UA     Clarity, UA     Glucose, UA Negative Negative   Bilirubin, UA     Ketones, UA neg    Spec Grav, UA     Blood, UA trace    pH, UA     POC,PROTEIN,UA Small (1+) Negative, Trace, Small (1+), Moderate (2+), Large (3+), 4+   Urobilinogen, UA     Nitrite, UA neg    Leukocytes, UA Negative Negative   Appearance     Odor      Assessment & Plan:  1) Low-risk pregnancy G1P0 at [redacted]w[redacted]d with an Estimated Date of Delivery: 11/03/19   2) Bilat pyelectasis (4 & 43mm), repeat at 32wks with growth (EFW 99th%); written info given  3) Suspicious urine, no symptoms> to culture  4) Rh neg, needs Rhogam at 28wks  Meds: No orders of the defined types were placed in this encounter.  Labs/procedures today: anatomy U/S, 2nd IT, urine culture  Plan:  Continue routine obstetrical care   Reviewed: Preterm labor symptoms and general obstetric precautions including but not limited to vaginal bleeding, contractions, leaking of fluid and fetal movement were reviewed in detail with the patient.  All questions were answered. Didn't ask about home bp cuff. Check bp weekly, let us know if >140/90.   Follow-up: Return in about 4 weeks (around 06/30/2019) for LROB, in person.  Orders Placed This Encounter  Procedures  . INTEGRATED 2  . POC Urinalysis Dipstick OB   Arabella Merles [facility] 06/02/2019 4:33 PM

## 2019-06-02 NOTE — Progress Notes (Signed)
[place] 18 wks,breech,posterior placenta gr 0,normal ovaries,cx 3.2 cm,bilat renal pelvic dilatation,RK 4 mm,LK  5.3 mm,fhr 148 bpm,svp of fluid 4.8 cm,EFW 287 g 99%,AC 97%,anatomy complete

## 2019-06-04 LAB — INTEGRATED 2
AFP MoM: 1.27
Alpha-Fetoprotein: 60.3 ng/mL
Crown Rump Length: 66 mm
DIA MoM: 2.44
DIA Value: 403.9 pg/mL
Estriol, Unconjugated: 1.8 ng/mL
Gest. Age on Collection Date: 12.7 weeks
Gestational Age: 18.7 weeks
Maternal Age at EDD: 20.6 yr
Nuchal Translucency (NT): 2.1 mm
Nuchal Translucency MoM: 1.42
Number of Fetuses: 1
PAPP-A MoM: 0.9
PAPP-A Value: 955.5 ng/mL
Test Results:: NEGATIVE
Weight: 142 [lb_av]
Weight: 145 [lb_av]
hCG MoM: 1.55
hCG Value: 37 IU/mL
uE3 MoM: 1.07

## 2019-06-04 LAB — URINE CULTURE

## 2019-06-30 ENCOUNTER — Ambulatory Visit (INDEPENDENT_AMBULATORY_CARE_PROVIDER_SITE_OTHER): Payer: Medicaid Other | Admitting: Women's Health

## 2019-06-30 ENCOUNTER — Encounter: Payer: Self-pay | Admitting: Women's Health

## 2019-06-30 VITALS — BP 112/65 | HR 101 | Wt 150.0 lb

## 2019-06-30 DIAGNOSIS — Z3402 Encounter for supervision of normal first pregnancy, second trimester: Secondary | ICD-10-CM

## 2019-06-30 DIAGNOSIS — Z1389 Encounter for screening for other disorder: Secondary | ICD-10-CM

## 2019-06-30 DIAGNOSIS — Z331 Pregnant state, incidental: Secondary | ICD-10-CM

## 2019-06-30 DIAGNOSIS — Z3A22 22 weeks gestation of pregnancy: Secondary | ICD-10-CM

## 2019-06-30 LAB — POCT URINALYSIS DIPSTICK OB
Blood, UA: NEGATIVE
Glucose, UA: NEGATIVE
Ketones, UA: NEGATIVE
Leukocytes, UA: NEGATIVE
Nitrite, UA: NEGATIVE

## 2019-06-30 NOTE — Progress Notes (Signed)
   LOW-RISK PREGNANCY VISIT Patient name: Colleen Welch MRN [id number]  Date of birth: 2000-02-10 Chief Complaint:   Routine Prenatal Visit  History of Present Illness:   Colleen Welch is a 19 y.o. G1P0 female at [redacted]w[redacted]d with an Estimated Date of Delivery: 11/03/19 being seen today for ongoing management of a low-risk pregnancy.  Depression screen PHQ 2/9 03/10/2019  Decreased Interest 0  Down, Depressed, Hopeless 0  PHQ - 2 Score 0    Today she reports no complaints. Contractions: Not present.  .  Movement: Present. denies leaking of fluid. Review of Systems:   Pertinent items are noted in HPI Denies abnormal vaginal discharge w/ itching/odor/irritation, headaches, visual changes, shortness of breath, chest pain, abdominal pain, severe nausea/vomiting, or problems with urination or bowel movements unless otherwise stated above. Pertinent History Reviewed:  Reviewed past medical,surgical, social, obstetrical and family history.  Reviewed problem list, medications and allergies. Physical Assessment:   Vitals:   06/30/19 1108  BP: 112/65  Pulse: (!) 101  Weight: 150 lb (68 kg)  Body mass index is 24.58 kg/m.        Physical Examination:   General appearance: Well appearing, and in no distress  Mental status: Alert, oriented to person, place, and time  Skin: Warm & dry  Cardiovascular: Normal heart rate noted  Respiratory: Normal respiratory effort, no distress  Abdomen: Soft, gravid, nontender  Pelvic: Cervical exam deferred         Extremities: Edema: None  Fetal Status: Fetal Heart Rate (bpm): 156 Fundal Height: 22 cm Movement: Present    Chaperone: n/a    Results for orders placed or performed in visit on 06/30/19 (from the past 24 hour(s))  POC Urinalysis Dipstick OB   Collection Time: 06/30/19 11:13 AM  Result Value Ref Range   Color, UA     Clarity, UA     Glucose, UA Negative Negative   Bilirubin, UA     Ketones, UA neg    Spec Grav, UA     Blood, UA neg    pH, UA     POC,PROTEIN,UA Trace Negative, Trace, Small (1+), Moderate (2+), Large (3+), 4+   Urobilinogen, UA     Nitrite, UA neg    Leukocytes, UA Negative Negative   Appearance     Odor      Assessment & Plan:  1) Low-risk pregnancy G1P0 at [redacted]w[redacted]d with an Estimated Date of Delivery: 11/03/19   2) Bilateral fetal pyelectasis, repeat u/s @ 32wks   Meds: No orders of the defined types were placed in this encounter.  Labs/procedures today: none  Plan:  Continue routine obstetrical care  Next visit: prefers will be in person for pn2    Reviewed: Preterm labor symptoms and general obstetric precautions including but not limited to vaginal bleeding, contractions, leaking of fluid and fetal movement were reviewed in detail with the patient.  All questions were answered. Has home bp cuff.  Check bp weekly, let us know if >140/90.   Follow-up: Return in about 4 weeks (around 07/28/2019) for LROB, PN2, CNM, in person.  Orders Placed This Encounter  Procedures  . POC Urinalysis Dipstick OB   Cheral Marker CNM, [facility] 06/30/2019 11:58 AM

## 2019-06-30 NOTE — Patient Instructions (Signed)
Colleen Welch, I greatly value your feedback.  If you receive a survey following your visit with [place] today, we appreciate you taking the time to fill it out.  Thanks, Colleen Welch, CNM, WHNP-BC   You will have your sugar test next visit.  Please do not eat or drink anything after midnight the night before you come, not even water.  You will be here for at least two hours.  Please make an appointment online for the bloodwork at [email or web address] for 8:30am (or as close to this as possible). Make sure you select the [facility] service center. The day of the appointment, check in with our office first, then you will go to [facility] to start the sugar test.    [facility] [place], [place] [address]) Entrance C, located off of [address] valet parking  Go to [email or web address] to register for FREE online childbirth classes   Call the office [phone]) or go to [facility] if:  You begin to have strong, frequent contractions  Your water breaks.  Sometimes it is a big gush of fluid, sometimes it is just a trickle that keeps getting your panties wet or running down your legs  You have vaginal bleeding.  It is normal to have a small amount of spotting if your cervix was checked.   You don't feel your baby moving like normal.  If you don't, get you something to eat and drink and lay down and focus on feeling your baby move.   If your baby is still not moving like normal, you should call the office or go to [facility].  [place] Pediatricians/Family Doctors:  [facility] [phone]            [place] [facility] [phone]                 [facility] [phone] (usually not accepting new patients unless you have family there already, you are always welcome to call and ask)       [facility] Department [phone]       [facility] Pediatricians/Family Doctors:   [facility]:  [phone]  [facility]: [phone]  [facility]: [phone]  [facility] Doctors:   [facility]: [phone]   [facility]: [phone]  [facility] Doctors:  [facility]: [phone]   Home Blood Pressure Monitoring for Patients   Your provider has recommended that you check your blood pressure (BP) at least once a week at home. If you do not have a blood pressure cuff at home, one will be provided for you. Contact your provider if you have not received your monitor within 1 week.   Helpful Tips for Accurate Home Blood Pressure Checks  . Don't smoke, exercise, or drink caffeine 30 minutes before checking your BP . Use the restroom before checking your BP (a full bladder can raise your pressure) . Relax in a comfortable upright chair . Feet on the ground . Left arm resting comfortably on a flat surface at the level of your heart . Legs uncrossed . Back supported . Sit quietly and don't talk . Place the cuff on your bare arm . Adjust snuggly, so that only two fingertips can fit between your skin and the top of the cuff . Check 2 readings separated by at least one minute . Keep a log of your BP readings . For a visual, please  reference this diagram: http://ccnc.care/bpdiagram  Provider Name: [facility]     Phone: [phone]  Zone 1: ALL CLEAR  Continue to monitor your symptoms:  . BP reading is less than 140 (top number) or less than 90 (bottom number)  . No right upper stomach pain . No headaches or seeing spots . No feeling nauseated or throwing up . No swelling in face and hands  Zone 2: CAUTION Call your doctor's office for any of the following:  . BP reading is greater than 140 (top number) or greater than 90 (bottom number)  . Stomach pain under your ribs in the middle or right side . Headaches or seeing spots . Feeling nauseated or throwing up . Swelling in  face and hands  Zone 3: EMERGENCY  Seek immediate medical care if you have any of the following:  . BP reading is greater than160 (top number) or greater than 110 (bottom number) . Severe headaches not improving with Tylenol . Serious difficulty catching your breath . Any worsening symptoms from Zone 2   Second Trimester of Pregnancy The second trimester is from week 13 through week 28, months 4 through 6. The second trimester is often a time when you feel your best. Your body has also adjusted to being pregnant, and you begin to feel better physically. Usually, morning sickness has lessened or quit completely, you may have more energy, and you may have an increase in appetite. The second trimester is also a time when the fetus is growing rapidly. At the end of the sixth month, the fetus is about 9 inches long and weighs about 1 pounds. You will likely begin to feel the baby move (quickening) between 18 and 20 weeks of the pregnancy. BODY CHANGES Your body goes through many changes during pregnancy. The changes vary from woman to woman.   Your weight will continue to increase. You will notice your lower abdomen bulging out.  You may begin to get stretch marks on your hips, abdomen, and breasts.  You may develop headaches that can be relieved by medicines approved by your health care provider.  You may urinate more often because the fetus is pressing on your bladder.  You may develop or continue to have heartburn as a result of your pregnancy.  You may develop constipation because certain hormones are causing the muscles that push waste through your intestines to slow down.  You may develop hemorrhoids or swollen, bulging veins (varicose veins).  You may have back pain because of the weight gain and pregnancy hormones relaxing your joints between the bones in your pelvis and as a result of a shift in weight and the muscles that support your balance.  Your breasts will continue to grow  and be tender.  Your gums may bleed and may be sensitive to brushing and flossing.  Dark spots or blotches (chloasma, mask of pregnancy) may develop on your face. This will likely fade after the baby is born.  A dark line from your belly button to the pubic area (linea nigra) may appear. This will likely fade after the baby is born.  You may have changes in your hair. These can include thickening of your hair, rapid growth, and changes in texture. Some women also have hair loss during or after pregnancy, or hair that feels dry or thin. Your hair will most likely return to normal after your baby is born. WHAT TO EXPECT AT YOUR PRENATAL VISITS During a routine prenatal visit:  You  will be weighed to make sure you and the fetus are growing normally.  Your blood pressure will be taken.  Your abdomen will be measured to track your baby's growth.  The fetal heartbeat will be listened to.  Any test results from the previous visit will be discussed. Your health care provider may ask you:  How you are feeling.  If you are feeling the baby move.  If you have had any abnormal symptoms, such as leaking fluid, bleeding, severe headaches, or abdominal cramping.  If you have any questions. Other tests that may be performed during your second trimester include:  Blood tests that check for:  Low iron levels (anemia).  Gestational diabetes (between 24 and 28 weeks).  Rh antibodies.  Urine tests to check for infections, diabetes, or protein in the urine.  An ultrasound to confirm the proper growth and development of the baby.  An amniocentesis to check for possible genetic problems.  Fetal screens for spina bifida and Down syndrome. HOME CARE INSTRUCTIONS   Avoid all smoking, herbs, alcohol, and unprescribed drugs. These chemicals affect the formation and growth of the baby.  Follow your health care provider's instructions regarding medicine use. There are medicines that are either  safe or unsafe to take during pregnancy.  Exercise only as directed by your health care provider. Experiencing uterine cramps is a good sign to stop exercising.  Continue to eat regular, healthy meals.  Wear a good support bra for breast tenderness.  Do not use hot tubs, steam rooms, or saunas.  Wear your seat belt at all times when driving.  Avoid raw meat, uncooked cheese, cat litter boxes, and soil used by cats. These carry germs that can cause birth defects in the baby.  Take your prenatal vitamins.  Try taking a stool softener (if your health care provider approves) if you develop constipation. Eat more high-fiber foods, such as fresh vegetables or fruit and whole grains. Drink plenty of fluids to keep your urine clear or pale yellow.  Take warm sitz baths to soothe any pain or discomfort caused by hemorrhoids. Use hemorrhoid cream if your health care provider approves.  If you develop varicose veins, wear support hose. Elevate your feet for 15 minutes, 3-4 times a day. Limit salt in your diet.  Avoid heavy lifting, wear low heel shoes, and practice good posture.  Rest with your legs elevated if you have leg cramps or low back pain.  Visit your dentist if you have not gone yet during your pregnancy. Use a soft toothbrush to brush your teeth and be gentle when you floss.  A sexual relationship may be continued unless your health care provider directs you otherwise.  Continue to go to all your prenatal visits as directed by your health care provider. SEEK MEDICAL CARE IF:   You have dizziness.  You have mild pelvic cramps, pelvic pressure, or nagging pain in the abdominal area.  You have persistent nausea, vomiting, or diarrhea.  You have a bad smelling vaginal discharge.  You have pain with urination. SEEK IMMEDIATE MEDICAL CARE IF:   You have a fever.  You are leaking fluid from your vagina.  You have spotting or bleeding from your vagina.  You have severe  abdominal cramping or pain.  You have rapid weight gain or loss.  You have shortness of breath with chest pain.  You notice sudden or extreme swelling of your face, hands, ankles, feet, or legs.  You have not felt your baby move  in over an hour.  You have severe headaches that do not go away with medicine.  You have vision changes. Document Released: 01/15/2001 Document Revised: 01/26/2013 Document Reviewed: 03/24/2012 [facility] Patient Information 2015 [place], [place]. This information is not intended to replace advice given to you by your health care provider. Make sure you discuss any questions you have with your health care provider.

## 2019-07-28 ENCOUNTER — Ambulatory Visit (INDEPENDENT_AMBULATORY_CARE_PROVIDER_SITE_OTHER): Payer: Medicaid Other | Admitting: Women's Health

## 2019-07-28 ENCOUNTER — Other Ambulatory Visit: Payer: Medicaid Other

## 2019-07-28 ENCOUNTER — Encounter: Payer: Self-pay | Admitting: Women's Health

## 2019-07-28 VITALS — BP 140/80 | HR 87 | Wt 158.0 lb

## 2019-07-28 DIAGNOSIS — Z23 Encounter for immunization: Secondary | ICD-10-CM | POA: Diagnosis not present

## 2019-07-28 DIAGNOSIS — Z3A26 26 weeks gestation of pregnancy: Secondary | ICD-10-CM

## 2019-07-28 DIAGNOSIS — Z1389 Encounter for screening for other disorder: Secondary | ICD-10-CM

## 2019-07-28 DIAGNOSIS — Z3402 Encounter for supervision of normal first pregnancy, second trimester: Secondary | ICD-10-CM

## 2019-07-28 DIAGNOSIS — Z331 Pregnant state, incidental: Secondary | ICD-10-CM

## 2019-07-28 LAB — POCT URINALYSIS DIPSTICK OB
Blood, UA: NEGATIVE
Glucose, UA: NEGATIVE
Ketones, UA: NEGATIVE
Leukocytes, UA: NEGATIVE
Nitrite, UA: NEGATIVE
POC,PROTEIN,UA: NEGATIVE

## 2019-07-28 MED ORDER — CITRANATAL ASSURE 35-1 & 300 MG PO MISC: ORAL | 11 refills | Status: AC

## 2019-07-28 NOTE — Progress Notes (Signed)
LOW-RISK PREGNANCY VISIT Patient name: Colleen Welch MRN [id number]  Date of birth: 09-21-2000 Chief Complaint:   Routine Prenatal Visit  History of Present Illness:   Colleen Welch is a 19 y.o. G1P0 female at [redacted]w[redacted]d with an Estimated Date of Delivery: 11/03/19 being seen today for ongoing management of a low-risk pregnancy.  Depression screen [facility] 2/9 07/28/2019 03/10/2019  Decreased Interest 1 0  Down, Depressed, Hopeless 1 0  PHQ - 2 Score 2 0  Altered sleeping 1 -  Tired, decreased energy 1 -  Change in appetite 1 -  Feeling bad or failure about yourself  0 -  Trouble concentrating 0 -  Moving slowly or fidgety/restless 0 -  Suicidal thoughts 0 -  PHQ-9 Score 5 -  Difficult doing work/chores Not difficult at all -    Today she reports no complaints. Denies ha, visual changes, ruq/epigastric pain, n/v.   Contractions: Not present. Vag. Bleeding: None.  Movement: Present. denies leaking of fluid. Review of Systems:   Pertinent items are noted in HPI Denies abnormal vaginal discharge w/ itching/odor/irritation, headaches, visual changes, shortness of breath, chest pain, abdominal pain, severe nausea/vomiting, or problems with urination or bowel movements unless otherwise stated above. Pertinent History Reviewed:  Reviewed past medical,surgical, social, obstetrical and family history.  Reviewed problem list, medications and allergies. Physical Assessment:   Vitals:   07/28/19 0847  BP: 140/80  Pulse: 87  Weight: 158 lb (71.7 kg)  Body mass index is 25.89 kg/m.        Physical Examination:   General appearance: Well appearing, and in no distress  Mental status: Alert, oriented to person, place, and time  Skin: Warm & dry  Cardiovascular: Normal heart rate noted  Respiratory: Normal respiratory effort, no distress  Abdomen: Soft, gravid, nontender  Pelvic: Cervical exam deferred         Extremities: Edema: None  Fetal Status: Fetal Heart Rate (bpm): 140 Fundal Height: 26  cm Movement: Present    Chaperone: n/a    Results for orders placed or performed in visit on 07/28/19 (from the past 24 hour(s))  POC Urinalysis Dipstick OB   Collection Time: 07/28/19  8:59 AM  Result Value Ref Range   Color, UA     Clarity, UA     Glucose, UA Negative Negative   Bilirubin, UA     Ketones, UA n    Spec Grav, UA     Blood, UA n    pH, UA     POC,PROTEIN,UA Negative Negative, Trace, Small (1+), Moderate (2+), Large (3+), 4+   Urobilinogen, UA     Nitrite, UA n    Leukocytes, UA Negative Negative   Appearance     Odor      Assessment & Plan:  1) Low-risk pregnancy G1P0 at [redacted]w[redacted]d with an Estimated Date of Delivery: 11/03/19   2) Mildly elevated bp, asymptomatic, no proteinuria, bp normal on recheck. Will have her come back [place] for bp check. Reviewed pre-e s/s, reasons to seek care   Meds:  Meds ordered this encounter  Medications  . Prenat w/o [id number] (CITRANATAL ASSURE) 35-1 & 300 MG tablet    Sig: One tablet and one capsule daily    Dispense:  30 each    Refill:  11    Order Specific Question:   Supervising Provider    Answer:   Lazaro Arms [[id number]]   Labs/procedures today: pn2, tdap  Plan:  Continue routine obstetrical care  Next visit: prefers in person    Reviewed: Preterm labor symptoms and general obstetric precautions including but not limited to vaginal bleeding, contractions, leaking of fluid and fetal movement were reviewed in detail with the patient.  All questions were answered. Has home bp cuff.  Check bp BID and bring log on Friday.   Follow-up: Return in about 2 days (around 07/30/2019) for nurse visit bp check and rhogam.  Orders Placed This Encounter  Procedures  . Tdap vaccine greater than or equal to 7yo IM  . POC Urinalysis Dipstick OB   Roma Schanz CNM, [facility] 07/28/2019 9:09 AM

## 2019-07-28 NOTE — Patient Instructions (Signed)
[name], I greatly value your feedback.  If you receive a survey following your visit with [place] today, we appreciate you taking the time to fill it out.  Thanks, Joellyn Haff, CNM, WHNP-BC   [facility] ([address] [place], [place] [address]) Entrance C, located off of [address] valet parking  Go to [email or web address] to register for FREE online childbirth classes   Call the office [phone]) or go to [facility] if:  You begin to have strong, frequent contractions  Your water breaks.  Sometimes it is a big gush of fluid, sometimes it is just a trickle that keeps getting your panties wet or running down your legs  You have vaginal bleeding.  It is normal to have a small amount of spotting if your cervix was checked.   You don't feel your baby moving like normal.  If you don't, get you something to eat and drink and lay down and focus on feeling your baby move.  You should feel at least 10 movements in 2 hours.  If you don't, you should call the office or go to [facility].    Tdap Vaccine  It is recommended that you get the Tdap vaccine during the third trimester of EACH pregnancy to help protect your baby from getting pertussis (whooping cough)  27-36 weeks is the BEST time to do this so that you can pass the protection on to your baby. During pregnancy is better than after pregnancy, but if you are unable to get it during pregnancy it will be offered at the hospital.   You can get this vaccine with [place], at the health department, your family doctor, or some local pharmacies  Everyone who will be around your baby should also be up-to-date on their vaccines before the baby comes. Adults (who are not pregnant) only need 1 dose of Tdap during adulthood.   [place] Pediatricians/Family Doctors:  [facility] [phone]            [facility] [phone]                 [facility]  [phone] (usually not accepting new patients unless you have family there already, you are always welcome to call and ask)       [facility] Department [phone]       [facility] Pediatricians/Family Doctors:   [facility]: [phone]  [facility]: [phone]  [facility]: [phone]  [facility] Doctors:   [facility]: [phone]   [facility]: [phone]  [facility] Doctors:  [facility]: [phone]   Home Blood Pressure Monitoring for Patients   Your provider has recommended that you check your blood pressure (BP) at least once a week at home. If you do not have a blood pressure cuff at home, one will be provided for you. Contact your provider if you have not received your monitor within 1 week.   Helpful Tips for Accurate Home Blood Pressure Checks  . Don't smoke, exercise, or drink caffeine 30 minutes before checking your BP . Use the restroom before checking your BP (a full bladder can raise your pressure) . Relax in a comfortable upright chair . Feet on the ground . Left arm resting comfortably on a flat surface at the level of your heart . Legs uncrossed . Back supported . Sit quietly and don't talk . Place the cuff on your  bare arm . Adjust snuggly, so that only two fingertips can fit between your skin and the top of the cuff . Check 2 readings separated by at least one minute . Keep a log of your BP readings . For a visual, please reference this diagram: http://ccnc.care/bpdiagram  Provider Name: [facility]     Phone: [phone]  Zone 1: ALL CLEAR  Continue to monitor your symptoms:  . BP reading is less than 140 (top number) or less than 90 (bottom number)  . No right upper stomach pain . No headaches or seeing spots . No feeling nauseated or throwing up . No swelling in face and hands  Zone 2: CAUTION Call your  doctor's office for any of the following:  . BP reading is greater than 140 (top number) or greater than 90 (bottom number)  . Stomach pain under your ribs in the middle or right side . Headaches or seeing spots . Feeling nauseated or throwing up . Swelling in face and hands  Zone 3: EMERGENCY  Seek immediate medical care if you have any of the following:  . BP reading is greater than160 (top number) or greater than 110 (bottom number) . Severe headaches not improving with Tylenol . Serious difficulty catching your breath . Any worsening symptoms from Zone 2   Third Trimester of Pregnancy The third trimester is from week 29 through week 42, months 7 through 9. The third trimester is a time when the fetus is growing rapidly. At the end of the ninth month, the fetus is about 20 inches in length and weighs 6-10 pounds.  BODY CHANGES Your body goes through many changes during pregnancy. The changes vary from woman to woman.   Your weight will continue to increase. You can expect to gain 25-35 pounds (11-16 kg) by the end of the pregnancy.  You may begin to get stretch marks on your hips, abdomen, and breasts.  You may urinate more often because the fetus is moving lower into your pelvis and pressing on your bladder.  You may develop or continue to have heartburn as a result of your pregnancy.  You may develop constipation because certain hormones are causing the muscles that push waste through your intestines to slow down.  You may develop hemorrhoids or swollen, bulging veins (varicose veins).  You may have pelvic pain because of the weight gain and pregnancy hormones relaxing your joints between the bones in your pelvis. Backaches may result from overexertion of the muscles supporting your posture.  You may have changes in your hair. These can include thickening of your hair, rapid growth, and changes in texture. Some women also have hair loss during or after pregnancy, or hair that  feels dry or thin. Your hair will most likely return to normal after your baby is born.  Your breasts will continue to grow and be tender. A yellow discharge may leak from your breasts called colostrum.  Your belly button may stick out.  You may feel short of breath because of your expanding uterus.  You may notice the fetus "dropping," or moving lower in your abdomen.  You may have a bloody mucus discharge. This usually occurs a few days to a week before labor begins.  Your cervix becomes thin and soft (effaced) near your due date. WHAT TO EXPECT AT YOUR PRENATAL EXAMS  You will have prenatal exams every 2 weeks until week 36. Then, you will have weekly prenatal exams. During a routine prenatal visit:  You will be weighed to make sure you and the fetus are growing normally.  Your blood pressure is taken.  Your abdomen will be measured to track your baby's growth.  The fetal heartbeat will be listened to.  Any test results from the previous visit will be discussed.  You may have a cervical check near your due date to see if you have effaced. At around 36 weeks, your caregiver will check your cervix. At the same time, your caregiver will also perform a test on the secretions of the vaginal tissue. This test is to determine if a type of bacteria, Group B streptococcus, is present. Your caregiver will explain this further. Your caregiver may ask you:  What your birth plan is.  How you are feeling.  If you are feeling the baby move.  If you have had any abnormal symptoms, such as leaking fluid, bleeding, severe headaches, or abdominal cramping.  If you have any questions. Other tests or screenings that may be performed during your third trimester include:  Blood tests that check for low iron levels (anemia).  Fetal testing to check the health, activity level, and growth of the fetus. Testing is done if you have certain medical conditions or if there are problems during the  pregnancy. FALSE LABOR You may feel small, irregular contractions that eventually go away. These are called Braxton Hicks contractions, or false labor. Contractions may last for hours, days, or even weeks before true labor sets in. If contractions come at regular intervals, intensify, or become painful, it is best to be seen by your caregiver.  SIGNS OF LABOR   Menstrual-like cramps.  Contractions that are 5 minutes apart or less.  Contractions that start on the top of the uterus and spread down to the lower abdomen and back.  A sense of increased pelvic pressure or back pain.  A watery or bloody mucus discharge that comes from the vagina. If you have any of these signs before the 37th week of pregnancy, call your caregiver right away. You need to go to the hospital to get checked immediately. HOME CARE INSTRUCTIONS   Avoid all smoking, herbs, alcohol, and unprescribed drugs. These chemicals affect the formation and growth of the baby.  Follow your caregiver's instructions regarding medicine use. There are medicines that are either safe or unsafe to take during pregnancy.  Exercise only as directed by your caregiver. Experiencing uterine cramps is a good sign to stop exercising.  Continue to eat regular, healthy meals.  Wear a good support bra for breast tenderness.  Do not use hot tubs, steam rooms, or saunas.  Wear your seat belt at all times when driving.  Avoid raw meat, uncooked cheese, cat litter boxes, and soil used by cats. These carry germs that can cause birth defects in the baby.  Take your prenatal vitamins.  Try taking a stool softener (if your caregiver approves) if you develop constipation. Eat more high-fiber foods, such as fresh vegetables or fruit and whole grains. Drink plenty of fluids to keep your urine clear or pale yellow.  Take warm sitz baths to soothe any pain or discomfort caused by hemorrhoids. Use hemorrhoid cream if your caregiver approves.  If you  develop varicose veins, wear support hose. Elevate your feet for 15 minutes, 3-4 times a day. Limit salt in your diet.  Avoid heavy lifting, wear low heal shoes, and practice good posture.  Rest a lot with your legs elevated if you have leg cramps or low  back pain.  Visit your dentist if you have not gone during your pregnancy. Use a soft toothbrush to brush your teeth and be gentle when you floss.  A sexual relationship may be continued unless your caregiver directs you otherwise.  Do not travel far distances unless it is absolutely necessary and only with the approval of your caregiver.  Take prenatal classes to understand, practice, and ask questions about the labor and delivery.  Make a trial run to the hospital.  Pack your hospital bag.  Prepare the baby's nursery.  Continue to go to all your prenatal visits as directed by your caregiver. SEEK MEDICAL CARE IF:  You are unsure if you are in labor or if your water has broken.  You have dizziness.  You have mild pelvic cramps, pelvic pressure, or nagging pain in your abdominal area.  You have persistent nausea, vomiting, or diarrhea.  You have a bad smelling vaginal discharge.  You have pain with urination. SEEK IMMEDIATE MEDICAL CARE IF:   You have a fever.  You are leaking fluid from your vagina.  You have spotting or bleeding from your vagina.  You have severe abdominal cramping or pain.  You have rapid weight loss or gain.  You have shortness of breath with chest pain.  You notice sudden or extreme swelling of your face, hands, ankles, feet, or legs.  You have not felt your baby move in over an hour.  You have severe headaches that do not go away with medicine.  You have vision changes. Document Released: 01/15/2001 Document Revised: 01/26/2013 Document Reviewed: 03/24/2012 [facility] Patient Information 2015 [place], [place]. This information is not intended to replace advice given to you by your health  care provider. Make sure you discuss any questions you have with your health care provider.

## 2019-07-29 LAB — GLUCOSE TOLERANCE, 2 HOURS W/ 1HR
Glucose, 1 hour: 122 mg/dL (ref 65–179)
Glucose, 2 hour: 80 mg/dL (ref 65–152)
Glucose, Fasting: 70 mg/dL (ref 65–91)

## 2019-07-29 LAB — ANTIBODY SCREEN: Antibody Screen: NEGATIVE

## 2019-07-29 LAB — CBC
Hematocrit: 33.9 % — ABNORMAL LOW (ref 34.0–46.6)
Hemoglobin: 11.5 g/dL (ref 11.1–15.9)
MCH: 29.9 pg (ref 26.6–33.0)
MCHC: 33.9 g/dL (ref 31.5–35.7)
MCV: 88 fL (ref 79–97)
Platelets: 317 10*3/uL (ref 150–450)
RBC: 3.84 x10E6/uL (ref 3.77–5.28)
RDW: 12.8 % (ref 11.7–15.4)
WBC: 16.4 10*3/uL — ABNORMAL HIGH (ref 3.4–10.8)

## 2019-07-29 LAB — RPR: RPR Ser Ql: NONREACTIVE

## 2019-07-29 LAB — HIV ANTIBODY (ROUTINE TESTING W REFLEX): HIV Screen 4th Generation wRfx: NONREACTIVE

## 2019-07-30 ENCOUNTER — Ambulatory Visit (INDEPENDENT_AMBULATORY_CARE_PROVIDER_SITE_OTHER): Payer: Medicaid Other | Admitting: *Deleted

## 2019-07-30 VITALS — BP 126/72 | HR 86 | Wt 159.0 lb

## 2019-07-30 DIAGNOSIS — Z6791 Unspecified blood type, Rh negative: Secondary | ICD-10-CM | POA: Diagnosis not present

## 2019-07-30 DIAGNOSIS — Z3A26 26 weeks gestation of pregnancy: Secondary | ICD-10-CM

## 2019-07-30 DIAGNOSIS — Z0130 Encounter for examination of blood pressure without abnormal findings: Secondary | ICD-10-CM

## 2019-07-30 DIAGNOSIS — O360930 Maternal care for other rhesus isoimmunization, third trimester, not applicable or unspecified: Secondary | ICD-10-CM

## 2019-07-30 MED ORDER — RHO D IMMUNE GLOBULIN 1500 UNIT/2ML IJ SOSY: 300.0000 ug | PREFILLED_SYRINGE | Freq: Once | INTRAMUSCULAR | Status: DC

## 2019-07-30 MED ORDER — RHO D IMMUNE GLOBULIN 1500 UNIT/2ML IJ SOSY: 300.0000 ug | PREFILLED_SYRINGE | Freq: Once | INTRAMUSCULAR | 0 refills | Status: DC

## 2019-07-30 NOTE — Progress Notes (Addendum)
   NURSE VISIT- BLOOD PRESSURE CHECK  SUBJECTIVE:  Colleen Welch is a 19 y.o. G1P0 female here for BP check. She is [redacted]w[redacted]d pregnant    HYPERTENSION ROS:  Pregnant/postpartum:  . Severe headaches that don't go away with tylenol/other medicines: No  . Visual changes (seeing spots/double/blurred vision) No  . Severe pain under right breast breast or in center of upper chest No  . Severe nausea/vomiting No  . Taking medicines as instructed not applicable   OBJECTIVE:  BP 126/72   Pulse 86   Wt 159 lb (72.1 kg)   LMP 01/27/2019 (Exact Date)   BMI 26.06 kg/m   Appearance alert, well appearing, and in no distress.  ASSESSMENT: Pregnancy [redacted]w[redacted]d  blood pressure check and Rhogam also given in right gluteal.   PLAN: Discussed with Joellyn Haff, CNM, [facility]   Recommendations: follow up in office in 4 weeks and monitor once a week.   Follow-up: in 4 weeks   Stoney Bang  07/30/2019 12:24 PM   Chart reviewed for nurse visit. Agree with plan of care.  Cheral Marker, [place] 07/30/2019 1:21 PM

## 2019-08-27 ENCOUNTER — Encounter: Payer: Medicaid Other | Admitting: Women's Health

## 2019-08-30 ENCOUNTER — Ambulatory Visit (INDEPENDENT_AMBULATORY_CARE_PROVIDER_SITE_OTHER): Payer: Medicaid Other | Admitting: Advanced Practice Midwife

## 2019-08-30 ENCOUNTER — Other Ambulatory Visit: Payer: Self-pay

## 2019-08-30 ENCOUNTER — Encounter: Payer: Self-pay | Admitting: Advanced Practice Midwife

## 2019-08-30 VITALS — BP 114/65 | HR 85 | Wt 167.5 lb

## 2019-08-30 DIAGNOSIS — Z331 Pregnant state, incidental: Secondary | ICD-10-CM

## 2019-08-30 DIAGNOSIS — Z3A30 30 weeks gestation of pregnancy: Secondary | ICD-10-CM

## 2019-08-30 DIAGNOSIS — F1290 Cannabis use, unspecified, uncomplicated: Secondary | ICD-10-CM

## 2019-08-30 DIAGNOSIS — R892 Abnormal level of other drugs, medicaments and biological substances in specimens from other organs, systems and tissues: Secondary | ICD-10-CM

## 2019-08-30 DIAGNOSIS — Z3403 Encounter for supervision of normal first pregnancy, third trimester: Secondary | ICD-10-CM

## 2019-08-30 DIAGNOSIS — O3663X0 Maternal care for excessive fetal growth, third trimester, not applicable or unspecified: Secondary | ICD-10-CM

## 2019-08-30 DIAGNOSIS — O358XX0 Maternal care for other (suspected) fetal abnormality and damage, not applicable or unspecified: Secondary | ICD-10-CM

## 2019-08-30 DIAGNOSIS — O99323 Drug use complicating pregnancy, third trimester: Secondary | ICD-10-CM

## 2019-08-30 DIAGNOSIS — Z1389 Encounter for screening for other disorder: Secondary | ICD-10-CM

## 2019-08-30 DIAGNOSIS — Z6791 Unspecified blood type, Rh negative: Secondary | ICD-10-CM

## 2019-08-30 LAB — POCT URINALYSIS DIPSTICK OB
Blood, UA: NEGATIVE
Glucose, UA: NEGATIVE
Ketones, UA: NEGATIVE
Leukocytes, UA: NEGATIVE
Nitrite, UA: NEGATIVE
POC,PROTEIN,UA: NEGATIVE

## 2019-08-30 NOTE — Progress Notes (Signed)
   LOW-RISK PREGNANCY VISIT Patient name: Colleen Welch MRN [id number]  Date of birth: 21-Jul-2000 Chief Complaint:   Routine Prenatal Visit  History of Present Illness:   Colleen Welch is a 19 y.o. G1P0 female at [redacted]w[redacted]d with an Estimated Date of Delivery: 11/03/19 being seen today for ongoing management of a low-risk pregnancy.  Today she reports feeling much bigger lately!; debating between breast and bottlefeeding- issue rev'd. Contractions: Not present. Vag. Bleeding: None.  Movement: Present. denies leaking of fluid. Review of Systems:   Pertinent items are noted in HPI Denies abnormal vaginal discharge w/ itching/odor/irritation, headaches, visual changes, shortness of breath, chest pain, abdominal pain, severe nausea/vomiting, or problems with urination or bowel movements unless otherwise stated above. Pertinent History Reviewed:  Reviewed past medical,surgical, social, obstetrical and family history.  Reviewed problem list, medications and allergies. Physical Assessment:   Vitals:   08/30/19 1500  BP: 114/65  Pulse: 85  Weight: 167 lb 8 oz (76 kg)  Body mass index is 27.45 kg/m.        Physical Examination:   General appearance: Well appearing, and in no distress  Mental status: Alert, oriented to person, place, and time  Skin: Warm & dry  Cardiovascular: Normal heart rate noted  Respiratory: Normal respiratory effort, no distress  Abdomen: Soft, gravid, nontender  Pelvic: Cervical exam deferred         Extremities: Edema: None  Fetal Status: Fetal Heart Rate (bpm): 141 Fundal Height: 29 cm Movement: Present    Results for orders placed or performed in visit on 08/30/19 (from the past 24 hour(s))  POC Urinalysis Dipstick OB   Collection Time: 08/30/19  3:01 PM  Result Value Ref Range   Color, UA     Clarity, UA     Glucose, UA Negative Negative   Bilirubin, UA     Ketones, UA neg    Spec Grav, UA     Blood, UA neg    pH, UA     POC,PROTEIN,UA Negative Negative,  Trace, Small (1+), Moderate (2+), Large (3+), 4+   Urobilinogen, UA     Nitrite, UA neg    Leukocytes, UA Negative Negative   Appearance     Odor      Assessment & Plan:  1) Low-risk pregnancy G1P0 at [redacted]w[redacted]d with an Estimated Date of Delivery: 11/03/19   2) Bilat pyelectasis, f/u at next visit  3) EFW 99th% at 18wks, f/u growth at next visit  4) +THC, repeat today   Meds: No orders of the defined types were placed in this encounter.  Labs/procedures today: UDS  Plan:  Continue routine obstetrical care with u/s at next visit  Reviewed: Preterm labor symptoms and general obstetric precautions including but not limited to vaginal bleeding, contractions, leaking of fluid and fetal movement were reviewed in detail with the patient.  All questions were answered. Has home bp cuff. Check bp weekly, let us know if >140/90.   Follow-up: LROB & u/s (pyelectasis and growth) 2wks  Orders Placed This Encounter  Procedures  . US OB Follow Up  . Pain Management Screening Profile (10S)  . POC Urinalysis Dipstick OB   Arabella Merles [facility] 08/30/2019 3:31 PM

## 2019-08-30 NOTE — Patient Instructions (Signed)
[name], I greatly value your feedback.  If you receive a survey following your visit with [place] today, we appreciate you taking the time to fill it out.  Thanks, Philipp Deputy, CNM   [facility] ([address] [place], [place] [address]) Entrance C, located off of [address] valet parking  Go to [email or web address] to register for FREE online childbirth classes   Call the office [phone]) or go to [facility] if:  You begin to have strong, frequent contractions  Your water breaks.  Sometimes it is a big gush of fluid, sometimes it is just a trickle that keeps getting your panties wet or running down your legs  You have vaginal bleeding.  It is normal to have a small amount of spotting if your cervix was checked.   You don't feel your baby moving like normal.  If you don't, get you something to eat and drink and lay down and focus on feeling your baby move.  You should feel at least 10 movements in 2 hours.  If you don't, you should call the office or go to [facility].    Tdap Vaccine  It is recommended that you get the Tdap vaccine during the third trimester of EACH pregnancy to help protect your baby from getting pertussis (whooping cough)  27-36 weeks is the BEST time to do this so that you can pass the protection on to your baby. During pregnancy is better than after pregnancy, but if you are unable to get it during pregnancy it will be offered at the hospital.   You can get this vaccine with [place], at the health department, your family doctor, or some local pharmacies  Everyone who will be around your baby should also be up-to-date on their vaccines before the baby comes. Adults (who are not pregnant) only need 1 dose of Tdap during adulthood.   [place] Pediatricians/Family Doctors:  [facility] [phone]            [facility] [phone]                 [facility] [phone]  (usually not accepting new patients unless you have family there already, you are always welcome to call and ask)       [facility] [phone]       [facility] Pediatricians/Family Doctors:   [facility]: [phone]  [facility]: [phone]  [facility]: [phone]  [facility] Doctors:   [facility]: [phone]   [facility]: [phone]  [facility] Doctors:  [facility]: [phone]   Home Blood Pressure Monitoring for Patients   Your provider has recommended that you check your blood pressure (BP) at least once a week at home. If you do not have a blood pressure cuff at home, one will be provided for you. Contact your provider if you have not received your monitor within 1 week.   Helpful Tips for Accurate Home Blood Pressure Checks  . Don't smoke, exercise, or drink caffeine 30 minutes before checking your BP . Use the restroom before checking your BP (a full bladder can raise your pressure) . Relax in a comfortable upright chair . Feet on the ground . Left arm resting comfortably on a flat surface at the level of your heart . Legs uncrossed . Back supported . Sit quietly and don't talk . Place the cuff on your bare  arm . Adjust snuggly, so that only two fingertips can fit between your skin and the top of the cuff . Check 2 readings separated by at least one minute . Keep a log of your BP readings . For a visual, please reference this diagram: http://ccnc.care/bpdiagram  Provider Name: [facility]     Phone: [phone]  Zone 1: ALL CLEAR  Continue to monitor your symptoms:  . BP reading is less than 140 (top number) or less than 90 (bottom number)  . No right upper stomach pain . No headaches or seeing spots . No feeling nauseated or throwing up . No swelling in face and hands  Zone 2: CAUTION Call your doctor's office for  any of the following:  . BP reading is greater than 140 (top number) or greater than 90 (bottom number)  . Stomach pain under your ribs in the middle or right side . Headaches or seeing spots . Feeling nauseated or throwing up . Swelling in face and hands  Zone 3: EMERGENCY  Seek immediate medical care if you have any of the following:  . BP reading is greater than160 (top number) or greater than 110 (bottom number) . Severe headaches not improving with Tylenol . Serious difficulty catching your breath . Any worsening symptoms from Zone 2   Third Trimester of Pregnancy The third trimester is from week 29 through week 42, months 7 through 9. The third trimester is a time when the fetus is growing rapidly. At the end of the ninth month, the fetus is about 20 inches in length and weighs 6-10 pounds.  BODY CHANGES Your body goes through many changes during pregnancy. The changes vary from woman to woman.   Your weight will continue to increase. You can expect to gain 25-35 pounds (11-16 kg) by the end of the pregnancy.  You may begin to get stretch marks on your hips, abdomen, and breasts.  You may urinate more often because the fetus is moving lower into your pelvis and pressing on your bladder.  You may develop or continue to have heartburn as a result of your pregnancy.  You may develop constipation because certain hormones are causing the muscles that push waste through your intestines to slow down.  You may develop hemorrhoids or swollen, bulging veins (varicose veins).  You may have pelvic pain because of the weight gain and pregnancy hormones relaxing your joints between the bones in your pelvis. Backaches may result from overexertion of the muscles supporting your posture.  You may have changes in your hair. These can include thickening of your hair, rapid growth, and changes in texture. Some women also have hair loss during or after pregnancy, or hair that feels dry or thin.  Your hair will most likely return to normal after your baby is born.  Your breasts will continue to grow and be tender. A yellow discharge may leak from your breasts called colostrum.  Your belly button may stick out.  You may feel short of breath because of your expanding uterus.  You may notice the fetus "dropping," or moving lower in your abdomen.  You may have a bloody mucus discharge. This usually occurs a few days to a week before labor begins.  Your cervix becomes thin and soft (effaced) near your due date. WHAT TO EXPECT AT YOUR PRENATAL EXAMS  You will have prenatal exams every 2 weeks until week 36. Then, you will have weekly prenatal exams. During a routine prenatal visit:  You  will be weighed to make sure you and the fetus are growing normally.  Your blood pressure is taken.  Your abdomen will be measured to track your baby's growth.  The fetal heartbeat will be listened to.  Any test results from the previous visit will be discussed.  You may have a cervical check near your due date to see if you have effaced. At around 36 weeks, your caregiver will check your cervix. At the same time, your caregiver will also perform a test on the secretions of the vaginal tissue. This test is to determine if a type of bacteria, Group B streptococcus, is present. Your caregiver will explain this further. Your caregiver may ask you:  What your birth plan is.  How you are feeling.  If you are feeling the baby move.  If you have had any abnormal symptoms, such as leaking fluid, bleeding, severe headaches, or abdominal cramping.  If you have any questions. Other tests or screenings that may be performed during your third trimester include:  Blood tests that check for low iron levels (anemia).  Fetal testing to check the health, activity level, and growth of the fetus. Testing is done if you have certain medical conditions or if there are problems during the pregnancy. FALSE  LABOR You may feel small, irregular contractions that eventually go away. These are called Braxton Hicks contractions, or false labor. Contractions may last for hours, days, or even weeks before true labor sets in. If contractions come at regular intervals, intensify, or become painful, it is best to be seen by your caregiver.  SIGNS OF LABOR   Menstrual-like cramps.  Contractions that are 5 minutes apart or less.  Contractions that start on the top of the uterus and spread down to the lower abdomen and back.  A sense of increased pelvic pressure or back pain.  A watery or bloody mucus discharge that comes from the vagina. If you have any of these signs before the 37th week of pregnancy, call your caregiver right away. You need to go to the hospital to get checked immediately. HOME CARE INSTRUCTIONS   Avoid all smoking, herbs, alcohol, and unprescribed drugs. These chemicals affect the formation and growth of the baby.  Follow your caregiver's instructions regarding medicine use. There are medicines that are either safe or unsafe to take during pregnancy.  Exercise only as directed by your caregiver. Experiencing uterine cramps is a good sign to stop exercising.  Continue to eat regular, healthy meals.  Wear a good support bra for breast tenderness.  Do not use hot tubs, steam rooms, or saunas.  Wear your seat belt at all times when driving.  Avoid raw meat, uncooked cheese, cat litter boxes, and soil used by cats. These carry germs that can cause birth defects in the baby.  Take your prenatal vitamins.  Try taking a stool softener (if your caregiver approves) if you develop constipation. Eat more high-fiber foods, such as fresh vegetables or fruit and whole grains. Drink plenty of fluids to keep your urine clear or pale yellow.  Take warm sitz baths to soothe any pain or discomfort caused by hemorrhoids. Use hemorrhoid cream if your caregiver approves.  If you develop varicose  veins, wear support hose. Elevate your feet for 15 minutes, 3-4 times a day. Limit salt in your diet.  Avoid heavy lifting, wear low heal shoes, and practice good posture.  Rest a lot with your legs elevated if you have leg cramps or low back  pain.  Visit your dentist if you have not gone during your pregnancy. Use a soft toothbrush to brush your teeth and be gentle when you floss.  A sexual relationship may be continued unless your caregiver directs you otherwise.  Do not travel far distances unless it is absolutely necessary and only with the approval of your caregiver.  Take prenatal classes to understand, practice, and ask questions about the labor and delivery.  Make a trial run to the hospital.  Pack your hospital bag.  Prepare the baby's nursery.  Continue to go to all your prenatal visits as directed by your caregiver. SEEK MEDICAL CARE IF:  You are unsure if you are in labor or if your water has broken.  You have dizziness.  You have mild pelvic cramps, pelvic pressure, or nagging pain in your abdominal area.  You have persistent nausea, vomiting, or diarrhea.  You have a bad smelling vaginal discharge.  You have pain with urination. SEEK IMMEDIATE MEDICAL CARE IF:   You have a fever.  You are leaking fluid from your vagina.  You have spotting or bleeding from your vagina.  You have severe abdominal cramping or pain.  You have rapid weight loss or gain.  You have shortness of breath with chest pain.  You notice sudden or extreme swelling of your face, hands, ankles, feet, or legs.  You have not felt your baby move in over an hour.  You have severe headaches that do not go away with medicine.  You have vision changes. Document Released: 01/15/2001 Document Revised: 01/26/2013 Document Reviewed: 03/24/2012 [employer] Patient Information 2015 [place], [place]. This information is not intended to replace advice given to you by your health care provider. Make  sure you discuss any questions you have with your health care provider.

## 2019-08-31 LAB — PMP SCREEN PROFILE (10S), URINE
Amphetamine Scrn, Ur: NEGATIVE ng/mL
BARBITURATE SCREEN URINE: NEGATIVE ng/mL
BENZODIAZEPINE SCREEN, URINE: NEGATIVE ng/mL
CANNABINOIDS UR QL SCN: POSITIVE ng/mL — AB
Cocaine (Metab) Scrn, Ur: NEGATIVE ng/mL
Creatinine(Crt), U: 182.1 mg/dL (ref 20.0–300.0)
Methadone Screen, Urine: NEGATIVE ng/mL
OXYCODONE+OXYMORPHONE UR QL SCN: NEGATIVE ng/mL
Opiate Scrn, Ur: NEGATIVE ng/mL
Ph of Urine: 6.2 (ref 4.5–8.9)
Phencyclidine Qn, Ur: NEGATIVE ng/mL
Propoxyphene Scrn, Ur: NEGATIVE ng/mL

## 2019-08-31 LAB — MED LIST OPTION NOT SELECTED

## 2019-09-14 ENCOUNTER — Other Ambulatory Visit: Payer: Self-pay | Admitting: Advanced Practice Midwife

## 2019-09-14 ENCOUNTER — Encounter: Payer: Self-pay | Admitting: Obstetrics & Gynecology

## 2019-09-14 ENCOUNTER — Ambulatory Visit (INDEPENDENT_AMBULATORY_CARE_PROVIDER_SITE_OTHER): Payer: Medicaid Other | Admitting: Obstetrics & Gynecology

## 2019-09-14 ENCOUNTER — Ambulatory Visit (INDEPENDENT_AMBULATORY_CARE_PROVIDER_SITE_OTHER): Payer: Medicaid Other

## 2019-09-14 VITALS — BP 123/77 | HR 82 | Wt 166.0 lb

## 2019-09-14 DIAGNOSIS — Z331 Pregnant state, incidental: Secondary | ICD-10-CM | POA: Diagnosis not present

## 2019-09-14 DIAGNOSIS — Z1389 Encounter for screening for other disorder: Secondary | ICD-10-CM | POA: Diagnosis not present

## 2019-09-14 DIAGNOSIS — Z3A32 32 weeks gestation of pregnancy: Secondary | ICD-10-CM | POA: Diagnosis not present

## 2019-09-14 DIAGNOSIS — Z6791 Unspecified blood type, Rh negative: Secondary | ICD-10-CM

## 2019-09-14 DIAGNOSIS — Z3403 Encounter for supervision of normal first pregnancy, third trimester: Secondary | ICD-10-CM

## 2019-09-14 LAB — POCT URINALYSIS DIPSTICK OB
Blood, UA: NEGATIVE
Glucose, UA: NEGATIVE
Ketones, UA: NEGATIVE
Leukocytes, UA: NEGATIVE
Nitrite, UA: NEGATIVE
POC,PROTEIN,UA: NEGATIVE

## 2019-09-14 NOTE — Progress Notes (Signed)
LOW-RISK PREGNANCY VISIT Patient name: Colleen Welch MRN [id number]  Date of birth: [birth year] Chief Complaint:   Routine Prenatal Visit (Ultrasound)  History of Present Illness:   Colleen Welch is a 19 y.o. G1P0 female at [redacted]w[redacted]d with an Estimated Date of Delivery: 11/03/19 being seen today for ongoing management of a low-risk pregnancy.  Depression screen [facility] 2/9 07/28/2019 03/10/2019  Decreased Interest 1 0  Down, Depressed, Hopeless 1 0  PHQ - 2 Score 2 0  Altered sleeping 1 -  Tired, decreased energy 1 -  Change in appetite 1 -  Feeling bad or failure about yourself  0 -  Trouble concentrating 0 -  Moving slowly or fidgety/restless 0 -  Suicidal thoughts 0 -  PHQ-9 Score 5 -  Difficult doing work/chores Not difficult at all -    Today she reports no complaints. Contractions: Not present. Vag. Bleeding: None.  Movement: Present. denies leaking of fluid. Review of Systems:   Pertinent items are noted in HPI Denies abnormal vaginal discharge w/ itching/odor/irritation, headaches, visual changes, shortness of breath, chest pain, abdominal pain, severe nausea/vomiting, or problems with urination or bowel movements unless otherwise stated above. Pertinent History Reviewed:  Reviewed past medical,surgical, social, obstetrical and family history.  Reviewed problem list, medications and allergies. Physical Assessment:   Vitals:   09/14/19 1402  BP: 123/77  Pulse: 82  Weight: 166 lb (75.3 kg)  Body mass index is 27.2 kg/m.        Physical Examination:   General appearance: Well appearing, and in no distress  Mental status: Alert, oriented to person, place, and time  Skin: Warm & dry  Cardiovascular: Normal heart rate noted  Respiratory: Normal respiratory effort, no distress  Abdomen: Soft, gravid, nontender  Pelvic: Cervical exam deferred         Extremities: Edema: None  Fetal Status:     Movement: Present    Chaperone: n/a    Results for orders placed or performed in  visit on 09/14/19 (from the past 24 hour(s))  POC Urinalysis Dipstick OB   Collection Time: 09/14/19  2:03 PM  Result Value Ref Range   Color, UA     Clarity, UA     Glucose, UA Negative Negative   Bilirubin, UA     Ketones, UA n    Spec Grav, UA     Blood, UA n    pH, UA     POC,PROTEIN,UA Negative Negative, Trace, Small (1+), Moderate (2+), Large (3+), 4+   Urobilinogen, UA     Nitrite, UA n    Leukocytes, UA Negative Negative   Appearance     Odor      Assessment & Plan:  1) Low-risk pregnancy G1P0 at [redacted]w[redacted]d with an Estimated Date of Delivery: 11/03/19   2) resolution of pyelectasisi,    Meds: No orders of the defined types were placed in this encounter.  Labs/procedures today:   Plan:  Continue routine obstetrical care  Next visit: prefers in person    Reviewed: Preterm labor symptoms and general obstetric precautions including but not limited to vaginal bleeding, contractions, leaking of fluid and fetal movement were reviewed in detail with the patient.  All questions were answered. Has home bp cuff. Rx faxed to . Check bp weekly, let us know if >140/90.   Follow-up: Return in about 3 weeks (around 10/05/2019) for LROB.  Orders Placed This Encounter  Procedures  . POC Urinalysis Dipstick OB   Lazaro Arms  09/14/2019 2:42 PM

## 2019-09-14 NOTE — Progress Notes (Signed)
[place] 32+6 wks,cephalic,posterior placenta gr 3,afi 16 cm,fhr 124 bpm,EFW 2277 g 70%,kidneys WNL

## 2019-10-05 ENCOUNTER — Ambulatory Visit (INDEPENDENT_AMBULATORY_CARE_PROVIDER_SITE_OTHER): Payer: Medicaid Other | Admitting: Women's Health

## 2019-10-05 ENCOUNTER — Encounter: Payer: Self-pay | Admitting: Women's Health

## 2019-10-05 VITALS — BP 130/84 | HR 90 | Wt 170.0 lb

## 2019-10-05 DIAGNOSIS — Z3403 Encounter for supervision of normal first pregnancy, third trimester: Secondary | ICD-10-CM

## 2019-10-05 DIAGNOSIS — Z331 Pregnant state, incidental: Secondary | ICD-10-CM

## 2019-10-05 DIAGNOSIS — Z1389 Encounter for screening for other disorder: Secondary | ICD-10-CM | POA: Diagnosis not present

## 2019-10-05 DIAGNOSIS — Z3A35 35 weeks gestation of pregnancy: Secondary | ICD-10-CM

## 2019-10-05 LAB — POCT URINALYSIS DIPSTICK OB
Blood, UA: NEGATIVE
Glucose, UA: NEGATIVE
Ketones, UA: NEGATIVE
Leukocytes, UA: NEGATIVE
Nitrite, UA: NEGATIVE
POC,PROTEIN,UA: NEGATIVE

## 2019-10-05 NOTE — Progress Notes (Signed)
LOW-RISK PREGNANCY VISIT Patient name: Colleen Welch MRN [id number]  Date of birth: March 16, 2000 Chief Complaint:   Routine Prenatal Visit  History of Present Illness:   Colleen Welch is a 19 y.o. G1P0 female at [redacted]w[redacted]d with an Estimated Date of Delivery: 11/03/19 being seen today for ongoing management of a low-risk pregnancy.  Depression screen [facility] 2/9 07/28/2019 03/10/2019  Decreased Interest 1 0  Down, Depressed, Hopeless 1 0  PHQ - 2 Score 2 0  Altered sleeping 1 -  Tired, decreased energy 1 -  Change in appetite 1 -  Feeling bad or failure about yourself  0 -  Trouble concentrating 0 -  Moving slowly or fidgety/restless 0 -  Suicidal thoughts 0 -  PHQ-9 Score 5 -  Difficult doing work/chores Not difficult at all -    Today she reports no complaints. Denies ha, visual changes, ruq/epigastric pain, n/v.   Contractions: Not present. Vag. Bleeding: None.  Movement: Present. denies leaking of fluid. Review of Systems:   Pertinent items are noted in HPI Denies abnormal vaginal discharge w/ itching/odor/irritation, headaches, visual changes, shortness of breath, chest pain, abdominal pain, severe nausea/vomiting, or problems with urination or bowel movements unless otherwise stated above. Pertinent History Reviewed:  Reviewed past medical,surgical, social, obstetrical and family history.  Reviewed problem list, medications and allergies. Physical Assessment:   Vitals:   10/05/19 1031  BP: 130/84  Pulse: 90  Weight: 170 lb (77.1 kg)  Body mass index is 27.86 kg/m.        Physical Examination:   General appearance: Well appearing, and in no distress  Mental status: Alert, oriented to person, place, and time  Skin: Warm & dry  Cardiovascular: Normal heart rate noted  Respiratory: Normal respiratory effort, no distress  Abdomen: Soft, gravid, nontender  Pelvic: Cervical exam deferred         Extremities: Edema: None  Fetal Status: Fetal Heart Rate (bpm): 152 Fundal Height: 34  cm Movement: Present    Chaperone: n/a    Results for orders placed or performed in visit on 10/05/19 (from the past 24 hour(s))  POC Urinalysis Dipstick OB   Collection Time: 10/05/19 10:34 AM  Result Value Ref Range   Color, UA     Clarity, UA     Glucose, UA Negative Negative   Bilirubin, UA     Ketones, UA neg    Spec Grav, UA     Blood, UA neg    pH, UA     POC,PROTEIN,UA Negative Negative, Trace, Small (1+), Moderate (2+), Large (3+), 4+   Urobilinogen, UA     Nitrite, UA neg    Leukocytes, UA Negative Negative   Appearance     Odor      Assessment & Plan:  1) Low-risk pregnancy G1P0 at [redacted]w[redacted]d with an Estimated Date of Delivery: 11/03/19   2) BP high normal, asymptomatic, no proteinuria, check daily at home, let us know if >140/90, reviewed pre-e s/s, reasons to seek care   Meds: No orders of the defined types were placed in this encounter.  Labs/procedures today: none  Plan:  Continue routine obstetrical care  Next visit: prefers will be in person for gbs    Reviewed: Preterm labor symptoms and general obstetric precautions including but not limited to vaginal bleeding, contractions, leaking of fluid and fetal movement were reviewed in detail with the patient.  All questions were answered.   Follow-up: Return in about 1 week (around 10/12/2019) for LROB,  in person, CNM.  Orders Placed This Encounter  Procedures  . POC Urinalysis Dipstick OB   Cheral Marker CNM, [facility] 10/05/2019 11:02 AM

## 2019-10-05 NOTE — Patient Instructions (Signed)
[name], I greatly value your feedback.  If you receive a survey following your visit with [place] today, we appreciate you taking the time to fill it out.  Thanks, Joellyn Haff, CNM, WHNP-BC  [facility] ([address] [place], [place] [address]) Entrance C, located off of [address] valet parking   Go to [email or web address] to register for FREE online childbirth classes    Call the office [phone]) or go to [facility] if:  You begin to have strong, frequent contractions  Your water breaks.  Sometimes it is a big gush of fluid, sometimes it is just a trickle that keeps getting your panties wet or running down your legs  You have vaginal bleeding.  It is normal to have a small amount of spotting if your cervix was checked.   You don't feel your baby moving like normal.  If you don't, get you something to eat and drink and lay down and focus on feeling your baby move.  You should feel at least 10 movements in 2 hours.  If you don't, you should call the office or go to [facility].   Call the office [phone]) or go to [facility] hospital for these signs of pre-eclampsia:  Severe headache that does not go away with Tylenol  Visual changes- seeing spots, double, blurred vision  Pain under your right breast or upper abdomen that does not go away with Tums or heartburn medicine  Nausea and/or vomiting  Severe swelling in your hands, feet, and face    Home Blood Pressure Monitoring for Patients   Your provider has recommended that you check your blood pressure (BP) at least once a week at home. If you do not have a blood pressure cuff at home, one will be provided for you. Contact your provider if you have not received your monitor within 1 week.   Helpful Tips for Accurate Home Blood Pressure Checks   Don't smoke, exercise, or drink caffeine 30 minutes before checking your BP  Use the restroom before checking your BP (a full  bladder can raise your pressure)  Relax in a comfortable upright chair  Feet on the ground  Left arm resting comfortably on a flat surface at the level of your heart  Legs uncrossed  Back supported  Sit quietly and don't talk  Place the cuff on your bare arm  Adjust snuggly, so that only two fingertips can fit between your skin and the top of the cuff  Check 2 readings separated by at least one minute  Keep a log of your BP readings  For a visual, please reference this diagram: http://ccnc.care/bpdiagram  Provider Name: [facility]     Phone: [phone]  Zone 1: ALL CLEAR  Continue to monitor your symptoms:   BP reading is less than 140 (top number) or less than 90 (bottom number)   No right upper stomach pain  No headaches or seeing spots  No feeling nauseated or throwing up  No swelling in face and hands  Zone 2: CAUTION Call your doctor's office for any of the following:   BP reading is greater than 140 (top number) or greater than 90 (bottom number)   Stomach pain under your ribs in the middle or right side  Headaches or seeing spots  Feeling nauseated or throwing up  Swelling in face and hands  Zone 3: EMERGENCY  Seek immediate medical care if you have any of  the following:   BP reading is greater than160 (top number) or greater than 110 (bottom number)  Severe headaches not improving with Tylenol  Serious difficulty catching your breath  Any worsening symptoms from Zone 2  Preterm Labor and Birth Information  The normal length of a pregnancy is 39-41 weeks. Preterm labor is when labor starts before 37 completed weeks of pregnancy. What are the risk factors for preterm labor? Preterm labor is more likely to occur in women who:  Have certain infections during pregnancy such as a bladder infection, sexually transmitted infection, or infection inside the uterus (chorioamnionitis).  Have a shorter-than-normal cervix.  Have gone into  preterm labor before.  Have had surgery on their cervix.  Are younger than age 41 or older than age 1.  Are African American.  Are pregnant with twins or multiple babies (multiple gestation).  Take street drugs or smoke while pregnant.  Do not gain enough weight while pregnant.  Became pregnant shortly after having been pregnant. What are the symptoms of preterm labor? Symptoms of preterm labor include:  Cramps similar to those that can happen during a menstrual period. The cramps may happen with diarrhea.  Pain in the abdomen or lower back.  Regular uterine contractions that may feel like tightening of the abdomen.  A feeling of increased pressure in the pelvis.  Increased watery or bloody mucus discharge from the vagina.  Water breaking (ruptured amniotic sac). Why is it important to recognize signs of preterm labor? It is important to recognize signs of preterm labor because babies who are born prematurely may not be fully developed. This can put them at an increased risk for:  Long-term (chronic) heart and lung problems.  Difficulty immediately after birth with regulating body systems, including blood sugar, body temperature, heart rate, and breathing rate.  Bleeding in the brain.  Cerebral palsy.  Learning difficulties.  Death. These risks are highest for babies who are born before 76 weeks of pregnancy. How is preterm labor treated? Treatment depends on the length of your pregnancy, your condition, and the health of your baby. It may involve: 1. Having a stitch (suture) placed in your cervix to prevent your cervix from opening too early (cerclage). 2. Taking or being given medicines, such as: ? Hormone medicines. These may be given early in pregnancy to help support the pregnancy. ? Medicine to stop contractions. ? Medicines to help mature the babys lungs. These may be prescribed if the risk of delivery is high. ? Medicines to prevent your baby from  developing cerebral palsy. If the labor happens before 34 weeks of pregnancy, you may need to stay in the hospital. What should I do if I think I am in preterm labor? If you think that you are going into preterm labor, call your health care provider right away. How can I prevent preterm labor in future pregnancies? To increase your chance of having a full-term pregnancy:  Do not use any tobacco products, such as cigarettes, chewing tobacco, and e-cigarettes. If you need help quitting, ask your health care provider.  Do not use street drugs or medicines that have not been prescribed to you during your pregnancy.  Talk with your health care provider before taking any herbal supplements, even if you have been taking them regularly.  Make sure you gain a healthy amount of weight during your pregnancy.  Watch for infection. If you think that you might have an infection, get it checked right away.  Make sure to  tell your health care provider if you have gone into preterm labor before. This information is not intended to replace advice given to you by your health care provider. Make sure you discuss any questions you have with your health care provider. Document Revised: 05/15/2018 Document Reviewed: 06/14/2015 Elsevier Patient Education  [place].

## 2019-10-12 ENCOUNTER — Other Ambulatory Visit: Payer: Self-pay

## 2019-10-12 ENCOUNTER — Encounter: Payer: Self-pay | Admitting: Women's Health

## 2019-10-12 ENCOUNTER — Other Ambulatory Visit (HOSPITAL_COMMUNITY)
Admission: RE | Admit: 2019-10-12 | Discharge: 2019-10-12 | Disposition: A | Discharge status: Home / Self Care | Payer: Medicaid Other | Source: Ambulatory Visit | Attending: Obstetrics & Gynecology | Admitting: Obstetrics & Gynecology

## 2019-10-12 ENCOUNTER — Ambulatory Visit (INDEPENDENT_AMBULATORY_CARE_PROVIDER_SITE_OTHER): Payer: Medicaid Other | Admitting: Women's Health

## 2019-10-12 VITALS — BP 124/77 | HR 79 | Wt 172.0 lb

## 2019-10-12 DIAGNOSIS — Z331 Pregnant state, incidental: Secondary | ICD-10-CM | POA: Diagnosis not present

## 2019-10-12 DIAGNOSIS — Z3403 Encounter for supervision of normal first pregnancy, third trimester: Secondary | ICD-10-CM

## 2019-10-12 DIAGNOSIS — Z3A36 36 weeks gestation of pregnancy: Secondary | ICD-10-CM

## 2019-10-12 DIAGNOSIS — Z1389 Encounter for screening for other disorder: Secondary | ICD-10-CM | POA: Diagnosis not present

## 2019-10-12 LAB — POCT URINALYSIS DIPSTICK OB
Blood, UA: NEGATIVE
Glucose, UA: NEGATIVE
Ketones, UA: NEGATIVE
Leukocytes, UA: NEGATIVE
Nitrite, UA: NEGATIVE
POC,PROTEIN,UA: NEGATIVE

## 2019-10-12 NOTE — Patient Instructions (Signed)
[name], I greatly value your feedback.  If you receive a survey following your visit with [place] today, we appreciate you taking the time to fill it out.  Thanks, Joellyn Haff, CNM, WHNP-BC  [facility] ([address] [place], [place] [address]) Entrance C, located off of [address] valet parking   Go to [email or web address] to register for FREE online childbirth classes    Call the office [phone]) or go to [facility] if:  You begin to have strong, frequent contractions  Your water breaks.  Sometimes it is a big gush of fluid, sometimes it is just a trickle that keeps getting your panties wet or running down your legs  You have vaginal bleeding.  It is normal to have a small amount of spotting if your cervix was checked.   You don't feel your baby moving like normal.  If you don't, get you something to eat and drink and lay down and focus on feeling your baby move.  You should feel at least 10 movements in 2 hours.  If you don't, you should call the office or go to [facility].   Call the office [phone]) or go to [facility] hospital for these signs of pre-eclampsia:  Severe headache that does not go away with Tylenol  Visual changes- seeing spots, double, blurred vision  Pain under your right breast or upper abdomen that does not go away with Tums or heartburn medicine  Nausea and/or vomiting  Severe swelling in your hands, feet, and face    Home Blood Pressure Monitoring for Patients   Your provider has recommended that you check your blood pressure (BP) at least once a week at home. If you do not have a blood pressure cuff at home, one will be provided for you. Contact your provider if you have not received your monitor within 1 week.   Helpful Tips for Accurate Home Blood Pressure Checks  . Don't smoke, exercise, or drink caffeine 30 minutes before checking your BP . Use the restroom before checking your BP (a full  bladder can raise your pressure) . Relax in a comfortable upright chair . Feet on the ground . Left arm resting comfortably on a flat surface at the level of your heart . Legs uncrossed . Back supported . Sit quietly and don't talk . Place the cuff on your bare arm . Adjust snuggly, so that only two fingertips can fit between your skin and the top of the cuff . Check 2 readings separated by at least one minute . Keep a log of your BP readings . For a visual, please reference this diagram: http://ccnc.care/bpdiagram  Provider Name: [facility]     Phone: [phone]  Zone 1: ALL CLEAR  Continue to monitor your symptoms:  . BP reading is less than 140 (top number) or less than 90 (bottom number)  . No right upper stomach pain . No headaches or seeing spots . No feeling nauseated or throwing up . No swelling in face and hands  Zone 2: CAUTION Call your doctor's office for any of the following:  . BP reading is greater than 140 (top number) or greater than 90 (bottom number)  . Stomach pain under your ribs in the middle or right side . Headaches or seeing spots . Feeling nauseated or throwing up . Swelling in face and hands  Zone 3: EMERGENCY  Seek immediate medical care if you have any of  the following:  . BP reading is greater than160 (top number) or greater than 110 (bottom number) . Severe headaches not improving with Tylenol . Serious difficulty catching your breath . Any worsening symptoms from Zone 2   Braxton Hicks Contractions Contractions of the uterus can occur throughout pregnancy, but they are not always a sign that you are in labor. You may have practice contractions called Braxton Hicks contractions. These false labor contractions are sometimes confused with true labor. What are Braxton Hicks contractions? Braxton Hicks contractions are tightening movements that occur in the muscles of the uterus before labor. Unlike true labor contractions, these  contractions do not result in opening (dilation) and thinning of the cervix. Toward the end of pregnancy (32-34 weeks), Braxton Hicks contractions can happen more often and may become stronger. These contractions are sometimes difficult to tell apart from true labor because they can be very uncomfortable. You should not feel embarrassed if you go to the hospital with false labor. Sometimes, the only way to tell if you are in true labor is for your health care provider to look for changes in the cervix. The health care provider will do a physical exam and may monitor your contractions. If you are not in true labor, the exam should show that your cervix is not dilating and your water has not broken. If there are no other health problems associated with your pregnancy, it is completely safe for you to be sent home with false labor. You may continue to have Braxton Hicks contractions until you go into true labor. How to tell the difference between true labor and false labor True labor  Contractions last 30-70 seconds.  Contractions become very regular.  Discomfort is usually felt in the top of the uterus, and it spreads to the lower abdomen and low back.  Contractions do not go away with walking.  Contractions usually become more intense and increase in frequency.  The cervix dilates and gets thinner. False labor  Contractions are usually shorter and not as strong as true labor contractions.  Contractions are usually irregular.  Contractions are often felt in the front of the lower abdomen and in the groin.  Contractions may go away when you walk around or change positions while lying down.  Contractions get weaker and are shorter-lasting as time goes on.  The cervix usually does not dilate or become thin. Follow these instructions at home:  1. Take over-the-counter and prescription medicines only as told by your health care provider. 2. Keep up with your usual exercises and follow other  instructions from your health care provider. 3. Eat and drink lightly if you think you are going into labor. 4. If Braxton Hicks contractions are making you uncomfortable: ? Change your position from lying down or resting to walking, or change from walking to resting. ? Sit and rest in a tub of warm water. ? Drink enough fluid to keep your urine pale yellow. Dehydration may cause these contractions. ? Do slow and deep breathing several times an hour. 5. Keep all follow-up prenatal visits as told by your health care provider. This is important. Contact a health care provider if:  You have a fever.  You have continuous pain in your abdomen. Get help right away if:  Your contractions become stronger, more regular, and closer together.  You have fluid leaking or gushing from your vagina.  You pass blood-tinged mucus (bloody show).  You have bleeding from your vagina.  You have low back   pain that you never had before.  You feel your baby's head pushing down and causing pelvic pressure.  Your baby is not moving inside you as much as it used to. Summary  Contractions that occur before labor are called Braxton Hicks contractions, false labor, or practice contractions.  Braxton Hicks contractions are usually shorter, weaker, farther apart, and less regular than true labor contractions. True labor contractions usually become progressively stronger and regular, and they become more frequent.  Manage discomfort from [facility] contractions by changing position, resting in a warm bath, drinking plenty of water, or practicing deep breathing. This information is not intended to replace advice given to you by your health care provider. Make sure you discuss any questions you have with your health care provider. Document Revised: 01/03/2017 Document Reviewed: 06/06/2016 Elsevier Patient Education  [place].

## 2019-10-12 NOTE — Progress Notes (Signed)
LOW-RISK PREGNANCY VISIT Patient name: Colleen Welch MRN [id number]  Date of birth: 04-06-2000 Chief Complaint:   Routine Prenatal Visit  History of Present Illness:   Colleen Welch is a 19 y.o. G1P0 female at [redacted]w[redacted]d with an Estimated Date of Delivery: 11/03/19 being seen today for ongoing management of a low-risk pregnancy.  Depression screen [facility] 2/9 07/28/2019 03/10/2019  Decreased Interest 1 0  Down, Depressed, Hopeless 1 0  PHQ - 2 Score 2 0  Altered sleeping 1 -  Tired, decreased energy 1 -  Change in appetite 1 -  Feeling bad or failure about yourself  0 -  Trouble concentrating 0 -  Moving slowly or fidgety/restless 0 -  Suicidal thoughts 0 -  PHQ-9 Score 5 -  Difficult doing work/chores Not difficult at all -    Today she reports no complaints. Contractions: Not present. Vag. Bleeding: None.  Movement: Present. denies leaking of fluid. Review of Systems:   Pertinent items are noted in HPI Denies abnormal vaginal discharge w/ itching/odor/irritation, headaches, visual changes, shortness of breath, chest pain, abdominal pain, severe nausea/vomiting, or problems with urination or bowel movements unless otherwise stated above. Pertinent History Reviewed:  Reviewed past medical,surgical, social, obstetrical and family history.  Reviewed problem list, medications and allergies. Physical Assessment:   Vitals:   10/12/19 1414  BP: 124/77  Pulse: 79  Weight: 172 lb (78 kg)  Body mass index is 28.19 kg/m.        Physical Examination:   General appearance: Well appearing, and in no distress  Mental status: Alert, oriented to person, place, and time  Skin: Warm & dry  Cardiovascular: Normal heart rate noted  Respiratory: Normal respiratory effort, no distress  Abdomen: Soft, gravid, nontender  Pelvic: Cervical exam deferred         Extremities: Edema: None  Fetal Status: Fetal Heart Rate (bpm): 147 Fundal Height: 36 cm Movement: Present Presentation: Vertex  Chaperone:  Peggy Dones    Results for orders placed or performed in visit on 10/12/19 (from the past 24 hour(s))  POC Urinalysis Dipstick OB   Collection Time: 10/12/19  2:17 PM  Result Value Ref Range   Color, UA     Clarity, UA     Glucose, UA Negative Negative   Bilirubin, UA     Ketones, UA neg    Spec Grav, UA     Blood, UA neg    pH, UA     POC,PROTEIN,UA Negative Negative, Trace, Small (1+), Moderate (2+), Large (3+), 4+   Urobilinogen, UA     Nitrite, UA neg    Leukocytes, UA Negative Negative   Appearance     Odor      Assessment & Plan:  1) Low-risk pregnancy G1P0 at [redacted]w[redacted]d with an Estimated Date of Delivery: 11/03/19    Meds: No orders of the defined types were placed in this encounter.  Labs/procedures today: gbs, gc/ct  Plan:  Continue routine obstetrical care  Next visit: prefers in person    Reviewed: Preterm labor symptoms and general obstetric precautions including but not limited to vaginal bleeding, contractions, leaking of fluid and fetal movement were reviewed in detail with the patient.  All questions were answered. Has home bp cuff. Check bp weekly, let us know if >140/90.   Follow-up: Return in about 1 week (around 10/19/2019) for LROB, CNM, in person.  Orders Placed This Encounter  Procedures  . Culture, beta strep (group b only)  . POC Urinalysis  Dipstick OB   Cheral Marker CNM, [facility] 10/12/2019 2:31 PM

## 2019-10-14 LAB — CERVICOVAGINAL ANCILLARY ONLY
Chlamydia: NEGATIVE
Comment: NEGATIVE
Comment: NORMAL
Neisseria Gonorrhea: NEGATIVE

## 2019-10-15 LAB — CULTURE, BETA STREP (GROUP B ONLY): Strep Gp B Culture: POSITIVE — AB

## 2019-10-18 ENCOUNTER — Other Ambulatory Visit: Payer: Self-pay

## 2019-10-18 ENCOUNTER — Encounter (HOSPITAL_COMMUNITY): Payer: Self-pay | Admitting: Obstetrics and Gynecology

## 2019-10-18 ENCOUNTER — Inpatient Hospital Stay (HOSPITAL_COMMUNITY)
Admission: AD | Admit: 2019-10-18 | Discharge: 2019-10-21 | DRG: 806 | Disposition: A | Discharge status: Home / Self Care | Payer: Medicaid Other | Attending: Obstetrics and Gynecology | Admitting: Obstetrics and Gynecology

## 2019-10-18 DIAGNOSIS — O99824 Streptococcus B carrier state complicating childbirth: Secondary | ICD-10-CM | POA: Diagnosis present

## 2019-10-18 DIAGNOSIS — Z6791 Unspecified blood type, Rh negative: Secondary | ICD-10-CM

## 2019-10-18 DIAGNOSIS — Z3403 Encounter for supervision of normal first pregnancy, third trimester: Secondary | ICD-10-CM

## 2019-10-18 DIAGNOSIS — Z3A37 37 weeks gestation of pregnancy: Secondary | ICD-10-CM

## 2019-10-18 DIAGNOSIS — Z3400 Encounter for supervision of normal first pregnancy, unspecified trimester: Secondary | ICD-10-CM

## 2019-10-18 DIAGNOSIS — Z23 Encounter for immunization: Secondary | ICD-10-CM

## 2019-10-18 DIAGNOSIS — Z20822 Contact with and (suspected) exposure to covid-19: Secondary | ICD-10-CM | POA: Diagnosis present

## 2019-10-18 DIAGNOSIS — F1290 Cannabis use, unspecified, uncomplicated: Secondary | ICD-10-CM | POA: Diagnosis present

## 2019-10-18 DIAGNOSIS — O09899 Supervision of other high risk pregnancies, unspecified trimester: Secondary | ICD-10-CM

## 2019-10-18 DIAGNOSIS — O26893 Other specified pregnancy related conditions, third trimester: Secondary | ICD-10-CM | POA: Diagnosis present

## 2019-10-18 DIAGNOSIS — Z2839 Other underimmunization status: Secondary | ICD-10-CM

## 2019-10-18 DIAGNOSIS — O99324 Drug use complicating childbirth: Secondary | ICD-10-CM | POA: Diagnosis present

## 2019-10-18 DIAGNOSIS — O26899 Other specified pregnancy related conditions, unspecified trimester: Secondary | ICD-10-CM

## 2019-10-18 NOTE — H&P (Addendum)
OBSTETRIC ADMISSION HISTORY AND PHYSICAL  Colleen Welch is a 19 y.o. female G1P0 with IUP at 33w5dby LMP c/w 8wk u/s presenting for SOL. She reports +FMs, No LOF, no VB, no blurry vision, headaches or peripheral edema, and RUQ pain.  She plans on breast feeding. She request POPs for birth control. [facility]  Dating: By LMP c/w 8wk u/s --->  Estimated Date of Delivery: 11/03/19  Sono:    09/14/19_0 , CWD, normal anatomy, cephalic presentation, 22355D 70% EFW   Prenatal History/Complications:  GBS pos Rubella NI RhIg neg Marijuana use during pregnancy  Past Medical History: History reviewed. No pertinent past medical history.  Past Surgical History: History reviewed. No pertinent surgical history.  Obstetrical History: OB History    Gravida  1   Para      Term      Preterm      AB      Living        SAB      TAB      Ectopic      Multiple      Live Births              Social History Social History   Socioeconomic History  . Marital status: Single    Spouse name: Not on file  . Number of children: Not on file  . Years of education: Not on file  . Highest education level: Not on file  Occupational History  . Not on file  Tobacco Use  . Smoking status: Never Smoker  . Smokeless tobacco: Never Used  Vaping Use  . Vaping Use: Former  Substance and Sexual Activity  . Alcohol use: Never  . Drug use: Not Currently    Types: Marijuana  . Sexual activity: Yes    Birth control/protection: None  Other Topics Concern  . Not on file  Social History Narrative  . Not on file   Social Determinants of Health   Financial Resource Strain: Low Risk   . Difficulty of Paying Living Expenses: Not very hard  Food Insecurity: Food Insecurity Present  . Worried About RCharity fundraiserin the Last Year: Sometimes true  . Ran Out of Food in the Last Year: Sometimes true  Transportation Needs: No Transportation Needs   . Lack of Transportation (Medical): No  . Lack of Transportation (Non-Medical): No  Physical Activity: Sufficiently Active  . Days of Exercise per Week: 4 days  . Minutes of Exercise per Session: 60 min  Stress: No Stress Concern Present  . Feeling of Stress : Only a little  Social Connections: Socially Isolated  . Frequency of Communication with Friends and Family: Twice a week  . Frequency of Social Gatherings with Friends and Family: Twice a week  . Attends Religious Services: Never  . Active Member of Clubs or Organizations: No  . Attends CArchivistMeetings: Never  . Marital Status: Never married    Family History: Family History  Problem Relation Age of Onset  . Cancer Paternal Grandmother        ovarian    Allergies: No Known Allergies  Medications Prior to Admission  Medication Sig Dispense Refill Last Dose  . Blood Pressure Monitor MISC For regular home bp monitoring during pregnancy 1 each 0   . Prenat w/o A-[id number] (CITRANATAL ASSURE) 35-1 & 300 MG tablet One tablet and one capsule daily 30 each 11  Review of Systems   All systems reviewed and negative except as stated in HPI  Blood pressure 126/60, pulse 85, temperature 98.7 F (37.1 C), resp. rate 18, height _0  (1.651 m), weight 78.5 kg, last menstrual period 01/27/2019, SpO2 100 %. General appearance: alert, cooperative and no distress Lungs: normal respiratory effort Heart: regular rate and rhythm Abdomen: soft, non-tender; gravid Pelvic: as noted below Extremities: Homans sign is negative, no sign of DVT Presentation: cephalic by RN exam Fetal monitoringBaseline: 150 bpm, Variability: Good {> 6 bpm), Accelerations: Reactive and Decelerations: Absent Uterine activityFrequency: Every 1-4 minutes Dilation: 3.5 Effacement (%): 80 Station: -1 Exam by:: Carollee Massed RN    Prenatal labs: ABO, Rh: O/Negative/-- (03/17 1605) Antibody: Negative (06/23 0837) Rubella: <0.90  (03/17 1605) RPR: Non Reactive (06/23 0837)  HBsAg: Negative (03/17 1605)  HIV: Non Reactive (06/23 0837)  GBS: Positive/-- (09/07 1520)  2 hr Glucola normal Genetic screening  normal Anatomy US bilateral renal pyelectasis noted, resolved on follow up, otherwise normal  Prenatal Transfer Tool  Maternal Diabetes: No Genetic Screening: Normal Maternal Ultrasounds/Referrals: Fetal Kidney Anomalies, resolved on follow up Fetal Ultrasounds or other Referrals:  None Maternal Substance Abuse:  Yes:  Type: Marijuana Significant Maternal Medications:  None Significant Maternal Lab Results: Group B Strep positive and Rh negative  No results found for this or any previous visit (from the past 24 hour(s)).  Patient Active Problem List   Diagnosis Date Noted  . Rh negative state in antepartum period 06/02/2019  . Rubella non-immune status, antepartum 04/23/2019  . 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 Supervision of normal first pregnancy 04/21/2019    Assessment/Plan:  Colleen Welch is a 19 y.o. G1P0 at 43w5dhere for SOL.  #Labor: Presented to MAU 3.5/80/-1, feeling contractions. Will continue expectant management and AROM when able if appropriate, after adequate GBS ppx. Will initiate pitocin if contractions space out. #Pain: PRN #FWB: Cat 1 #ID: GBS pos, PCN #MOF: breast #MOC:POPs #Circ: yes, outpatient #THC use: positive UDS x2 in pregnancy, SW consult post partum #Rh neg: RhIg workup post partum #Rubella NI: MMR post partum  AArrie Senate MD  10/19/2019, 12:02 AM

## 2019-10-18 NOTE — MAU Note (Signed)
Ctx started this afternoon and have gotten stronger. Denies any vaginal bleeding or discharge.Good fetal movement felt

## 2019-10-19 ENCOUNTER — Encounter: Payer: Medicaid Other | Admitting: Women's Health

## 2019-10-19 ENCOUNTER — Encounter (HOSPITAL_COMMUNITY): Payer: Self-pay | Admitting: Obstetrics and Gynecology

## 2019-10-19 DIAGNOSIS — O26893 Other specified pregnancy related conditions, third trimester: Secondary | ICD-10-CM | POA: Diagnosis present

## 2019-10-19 DIAGNOSIS — Z6791 Unspecified blood type, Rh negative: Secondary | ICD-10-CM | POA: Diagnosis not present

## 2019-10-19 DIAGNOSIS — O99324 Drug use complicating childbirth: Secondary | ICD-10-CM | POA: Diagnosis present

## 2019-10-19 DIAGNOSIS — Z20822 Contact with and (suspected) exposure to covid-19: Secondary | ICD-10-CM | POA: Diagnosis present

## 2019-10-19 DIAGNOSIS — O99824 Streptococcus B carrier state complicating childbirth: Secondary | ICD-10-CM | POA: Diagnosis present

## 2019-10-19 DIAGNOSIS — Z3A37 37 weeks gestation of pregnancy: Secondary | ICD-10-CM

## 2019-10-19 DIAGNOSIS — Z23 Encounter for immunization: Secondary | ICD-10-CM | POA: Diagnosis not present

## 2019-10-19 DIAGNOSIS — F1290 Cannabis use, unspecified, uncomplicated: Secondary | ICD-10-CM | POA: Diagnosis present

## 2019-10-19 LAB — CBC
HCT: 32.4 % — ABNORMAL LOW (ref 36.0–46.0)
Hemoglobin: 10.9 g/dL — ABNORMAL LOW (ref 12.0–15.0)
MCH: 29.5 pg (ref 26.0–34.0)
MCHC: 33.6 g/dL (ref 30.0–36.0)
MCV: 87.8 fL (ref 80.0–100.0)
Platelets: 294 10*3/uL (ref 150–400)
RBC: 3.69 MIL/uL — ABNORMAL LOW (ref 3.87–5.11)
RDW: 13.3 % (ref 11.5–15.5)
WBC: 22.1 10*3/uL — ABNORMAL HIGH (ref 4.0–10.5)
nRBC: 0 % (ref 0.0–0.2)

## 2019-10-19 LAB — TYPE AND SCREEN
ABO/RH(D): O NEG
Antibody Screen: NEGATIVE

## 2019-10-19 LAB — RPR: RPR Ser Ql: NONREACTIVE

## 2019-10-19 LAB — SARS CORONAVIRUS 2 BY RT PCR (HOSPITAL ORDER, PERFORMED IN ~~LOC~~ HOSPITAL LAB): SARS Coronavirus 2: NEGATIVE

## 2019-10-19 MED ORDER — DIPHENHYDRAMINE HCL 25 MG PO CAPS: 25.0000 mg | ORAL_CAPSULE | Freq: Four times a day (QID) | ORAL | Status: DC | PRN

## 2019-10-19 MED ORDER — ONDANSETRON HCL 4 MG/2ML IJ SOLN: 4.0000 mg | INTRAMUSCULAR | Status: DC | PRN

## 2019-10-19 MED ORDER — ACETAMINOPHEN 325 MG PO TABS: 650.0000 mg | ORAL_TABLET | ORAL | Status: DC | PRN

## 2019-10-19 MED ORDER — FENTANYL CITRATE (PF) 100 MCG/2ML IJ SOLN
50.0000 ug | INTRAMUSCULAR | Status: DC | PRN
  Administered 2019-10-19: 100 ug via INTRAVENOUS
  Filled 2019-10-19: qty 2

## 2019-10-19 MED ORDER — DIBUCAINE (PERIANAL) 1 % EX OINT: 1.0000 "application " | TOPICAL_OINTMENT | CUTANEOUS | Status: DC | PRN

## 2019-10-19 MED ORDER — PENICILLIN G POT IN DEXTROSE 60000 UNIT/ML IV SOLN: 3.0000 10*6.[IU] | INTRAVENOUS | Status: DC

## 2019-10-19 MED ORDER — BENZOCAINE-MENTHOL 20-0.5 % EX AERO: 1.0000 "application " | INHALATION_SPRAY | CUTANEOUS | Status: DC | PRN

## 2019-10-19 MED ORDER — MAGNESIUM HYDROXIDE 400 MG/5ML PO SUSP: 30.0000 mL | ORAL | Status: DC | PRN

## 2019-10-19 MED ORDER — SODIUM CHLORIDE 0.9 % IV SOLN
5.0000 10*6.[IU] | Freq: Once | INTRAVENOUS | Status: AC
  Administered 2019-10-19: 5 10*6.[IU] via INTRAVENOUS
  Filled 2019-10-19: qty 5

## 2019-10-19 MED ORDER — SOD CITRATE-CITRIC ACID 500-334 MG/5ML PO SOLN: 30.0000 mL | ORAL | Status: DC | PRN

## 2019-10-19 MED ORDER — WITCH HAZEL-GLYCERIN EX PADS: 1.0000 "application " | MEDICATED_PAD | CUTANEOUS | Status: DC | PRN

## 2019-10-19 MED ORDER — SIMETHICONE 80 MG PO CHEW: 80.0000 mg | CHEWABLE_TABLET | ORAL | Status: DC | PRN

## 2019-10-19 MED ORDER — OXYCODONE-ACETAMINOPHEN 5-325 MG PO TABS: 1.0000 | ORAL_TABLET | ORAL | Status: DC | PRN

## 2019-10-19 MED ORDER — PRENATAL MULTIVITAMIN CH
1.0000 | ORAL_TABLET | Freq: Every day | ORAL | Status: DC
  Administered 2019-10-19 – 2019-10-20 (×2): 1 via ORAL
  Filled 2019-10-19 (×2): qty 1

## 2019-10-19 MED ORDER — OXYTOCIN BOLUS FROM INFUSION
333.0000 mL | Freq: Once | INTRAVENOUS | Status: AC
  Administered 2019-10-19: 600 mL via INTRAVENOUS

## 2019-10-19 MED ORDER — COCONUT OIL OIL: 1.0000 "application " | TOPICAL_OIL | Status: DC | PRN

## 2019-10-19 MED ORDER — ONDANSETRON HCL 4 MG PO TABS: 4.0000 mg | ORAL_TABLET | ORAL | Status: DC | PRN

## 2019-10-19 MED ORDER — LIDOCAINE HCL (PF) 1 % IJ SOLN
30.0000 mL | INTRAMUSCULAR | Status: AC | PRN
  Administered 2019-10-19: 30 mL via SUBCUTANEOUS
  Filled 2019-10-19: qty 30

## 2019-10-19 MED ORDER — RHO D IMMUNE GLOBULIN 1500 UNIT/2ML IJ SOSY
300.0000 ug | PREFILLED_SYRINGE | Freq: Once | INTRAMUSCULAR | Status: AC
  Administered 2019-10-19: 300 ug via INTRAVENOUS
  Filled 2019-10-19: qty 2

## 2019-10-19 MED ORDER — LACTATED RINGERS IV SOLN: 500.0000 mL | INTRAVENOUS | Status: DC | PRN

## 2019-10-19 MED ORDER — IBUPROFEN 600 MG PO TABS
600.0000 mg | ORAL_TABLET | Freq: Four times a day (QID) | ORAL | Status: DC
  Administered 2019-10-19 – 2019-10-21 (×9): 600 mg via ORAL
  Filled 2019-10-19 (×9): qty 1

## 2019-10-19 MED ORDER — TETANUS-DIPHTH-ACELL PERTUSSIS 5-2.5-18.5 LF-MCG/0.5 IM SUSP: 0.5000 mL | Freq: Once | INTRAMUSCULAR | Status: DC

## 2019-10-19 MED ORDER — SENNOSIDES-DOCUSATE SODIUM 8.6-50 MG PO TABS
2.0000 | ORAL_TABLET | ORAL | Status: DC
  Administered 2019-10-19 – 2019-10-20 (×2): 2 via ORAL
  Filled 2019-10-19 (×2): qty 2

## 2019-10-19 MED ORDER — LACTATED RINGERS IV SOLN: INTRAVENOUS | Status: DC

## 2019-10-19 MED ORDER — OXYTOCIN-SODIUM CHLORIDE 30-0.9 UT/500ML-% IV SOLN
2.5000 [IU]/h | INTRAVENOUS | Status: DC
  Administered 2019-10-19: 2.5 [IU]/h via INTRAVENOUS
  Filled 2019-10-19: qty 500

## 2019-10-19 MED ORDER — ONDANSETRON HCL 4 MG/2ML IJ SOLN: 4.0000 mg | Freq: Four times a day (QID) | INTRAMUSCULAR | Status: DC | PRN

## 2019-10-19 MED ORDER — OXYCODONE-ACETAMINOPHEN 5-325 MG PO TABS: 2.0000 | ORAL_TABLET | ORAL | Status: DC | PRN

## 2019-10-19 NOTE — Lactation Note (Signed)
This note was copied from a baby's chart. Lactation Consultation Note  Patient Name: Colleen Welch AGTXM'I Date: 10/19/2019 Reason for consult: Initial assessment   Mother P1, infant 37+6, infant is 12 hours. Mother was given [facility] brochure and basic teaching done.  Mother attempting to latch infant when I arrived in the room. Mother taught to latch infant with a asymmetrical latch technique. Infant latched. Mother taught to flanged infants lips for wider gape. Taught to adjust infants chin. Observed audible swallows. Parents heart swallows. Infant sustained latch for 25 mins.   Reviewed hand expression with mother. Observed large drops of colostrum.   Mother to continue to cue base feed infant and feed at least 8-12 times or more in 24 hours and advised to allow for cluster feeding infant as needed.  Mother to continue to due STS. Mother is aware of available LC services at [facility], BFSG'S, OP Dept, and phone # for questions or concerns about breastfeeding.  Mother receptive to all teaching and plan of care.     Maternal Data Has patient been taught Hand Expression?: Yes Does the patient have breastfeeding experience prior to this delivery?: No  Feeding Feeding Type: Breast Fed  LATCH Score Latch: Grasps breast easily, tongue down, lips flanged, rhythmical sucking.  Audible Swallowing: Spontaneous and intermittent  Type of Nipple: Everted at rest and after stimulation  Comfort (Breast/Nipple): Soft / non-tender  Hold (Positioning): Assistance needed to correctly position infant at breast and maintain latch.  LATCH Score: 9  Interventions Interventions: Assisted with latch;Skin to skin;Hand express;Breast compression;Adjust position;Support pillows;Position options  Lactation Tools Discussed/Used WIC Program: Yes   Consult Status Consult Status: Follow-up Date: 10/19/19 Follow-up type: In-patient    Stevan Born [facility] 10/19/2019, 2:58 PM

## 2019-10-19 NOTE — Discharge Summary (Signed)
Postpartum Discharge Summary    Patient Name: Colleen Welch DOB: Jan 12, 2001 MRN: [id number]  Date of admission: 10/18/2019 Delivery date:10/19/2019  Delivering provider: Arrie Senate  Date of discharge: 10/21/2019  Admitting diagnosis: Normal labor [O80, Z37.9] Intrauterine pregnancy: [redacted]w[redacted]d    Secondary diagnosis:  Active Problems:   Supervision of normal first pregnancy   Rubella non-immune status, antepartum   Marijuana use   Rh negative state in antepartum period   Normal labor   Vaginal delivery  Additional problems: none    Discharge diagnosis: Term Pregnancy Delivered                                              Post partum procedures:rhogam, MMR vaccine Augmentation: N/A Complications: None  Hospital course: Onset of Labor With Vaginal Delivery      19y.o. yo G1P1001 at 347w6das admitted in Active Labor on 10/18/2019. Patient had an uncomplicated labor course as follows:  Membrane Rupture Time/Date: 12:37 AM ,10/19/2019   Delivery Method:Vaginal, Spontaneous  Episiotomy: None  Lacerations:  2nd degree;Perineal  Patient had an uncomplicated postpartum course.  She is ambulating, tolerating a regular diet, passing flatus, and urinating well. Patient is discharged home in stable condition on 10/21/19.  Newborn Data: Birth date:10/19/2019  Birth time:1:33 AM  Gender:Female  Living status:Living  Apgars:8 ,9  Weight:3500 g   Magnesium Sulfate received: No BMZ received: No Rhophylac:Yes MMR:Yes (postpartum) T-DaP:Given prenatally Flu: No Transfusion:No  Physical exam  Vitals:   10/20/19 0500 10/20/19 1425 10/20/19 2250 10/21/19 0500  BP: 125/79 123/70 124/69 117/75  Pulse: 76 64 65 98  Resp: _0 Temp: 98 F (36.7 C) 98.2 F (36.8 C) 98.8 F (37.1 C) 98.5 F (36.9 C)  TempSrc: Oral Oral Oral Oral  SpO2: 99% 99%    Weight:      Height:       General: alert, cooperative and no distress Lochia: appropriate Uterine Fundus: firm Incision:  N/A DVT Evaluation: No evidence of DVT seen on physical exam. No cords or calf tenderness. No significant calf/ankle edema. Labs: Lab Results  Component Value Date   WBC 22.1 (H) 10/19/2019   HGB 10.9 (L) 10/19/2019   HCT 32.4 (L) 10/19/2019   MCV 87.8 10/19/2019   PLT 294 10/19/2019   No flowsheet data found. Edinburgh Score: Edinburgh Postnatal Depression Scale Screening Tool 10/20/2019  I have been able to laugh and see the funny side of things. 0  I have looked forward with enjoyment to things. 0  I have blamed myself unnecessarily when things went wrong. 0  I have been anxious or worried for no good reason. 2  I have felt scared or panicky for no good reason. 0  Things have been getting on top of me. 0  I have been so unhappy that I have had difficulty sleeping. 0  I have felt sad or miserable. 0  I have been so unhappy that I have been crying. 0  The thought of harming myself has occurred to me. 0  Edinburgh Postnatal Depression Scale Total 2     After visit meds:  Allergies as of 10/21/2019   No Known Allergies     Medication List    STOP taking these medications   Blood Pressure Monitor Misc   DHA 200 MG Caps  TAKE these medications   acetaminophen 325 MG tablet Commonly known as: Tylenol Take 2 tablets (650 mg total) by mouth every 6 (six) hours as needed for mild pain (for pain scale < 4).   CitraNatal Assure 35-1 & 300 MG tablet One tablet and one capsule daily   coconut oil Oil Apply 1 application topically as needed (nipple pain).   ibuprofen 600 MG tablet Commonly known as: ADVIL Take 1 tablet (600 mg total) by mouth every 8 (eight) hours as needed for moderate pain or cramping.   norethindrone 0.35 MG tablet Commonly known as: Ortho Micronor Take 1 tablet (0.35 mg total) by mouth daily.       Discharge home in stable condition Infant Feeding: Breast Infant Disposition:home with mother Discharge instruction: per After Visit Summary  and Postpartum booklet. Activity: Advance as tolerated. Pelvic rest for 6 weeks.  Diet: routine diet Future Appointments: Future Appointments  Date Time Provider [place]  11/16/2019 11:30 AM Roma Schanz, CNM [facility]   Follow up Visit: message sent to [facility] for scheduling  Please schedule this patient for a In person postpartum visit in 4 weeks with the following provider: Any provider. Additional Postpartum F/U:none  Low risk pregnancy complicated by: n/a Delivery mode:  Vaginal, Spontaneous  Anticipated Birth Control:  POPs (prescribed on discharge)   10/21/2019 Randa Ngo, MD

## 2019-10-19 NOTE — Progress Notes (Signed)
CSW received call from J. Henderson with [place] CPS advising CSW that she would be here to hospital to speak with  MOB on tomorrow 10/20/19 by 10:30am.    Carsyn Taubman S. Journe Hallmark, MSW, LCSW [facility] [place] [phone]  

## 2019-10-19 NOTE — Discharge Instructions (Signed)

## 2019-10-19 NOTE — Clinical Social Work Maternal (Signed)
CLINICAL SOCIAL WORK MATERNAL/CHILD NOTE  Patient Details  Name: Colleen Welch MRN: [id number] Date of Birth: 01/12/2001  Date:  09-28-19  Clinical Social Worker Initiating Note:  Hortencia Pilar, LCSW Date/Time: Initiated:  10/28/19/0100     Child's Name:  Colleen Welch   Biological Parents:  Mother, Father Obrian, Bul[facility])   Need for Interpreter:  None   Reason for Referral:  Current Substance Use/Substance Use During Pregnancy    Address:  [address] [place] [address]    Phone number:  [phone] (home)     Additional phone number: none   Household Members/Support Persons (HM/SP):   Household Member/Support Person 1   HM/SP Name Relationship DOB or Age  HM/SP -1  Charlestine Night [facility] 06[birth year]10/1999  HM/SP -2 Konrad Penta  [facility]    HM/SP -3  Cleon Gustin  [facility]    HM/SP -4        HM/SP -5        HM/SP -6        HM/SP -7        HM/SP -8          Natural Supports (not living in the home):  Parent   Professional Supports: None   Employment: Other (comment)   Type of Work: on leave from [employer]   Education:  High school graduate   Homebound arranged:  n/a  Surveyor, quantity Resources:  Medicaid   Other Resources:  Sales executive , WIC   Cultural/Religious Considerations Which May Impact Care:  none   Strengths:  Ability to meet basic needs , Compliance with medical plan , Home prepared for child , Pediatrician chosen   Psychotropic Medications:       None   Pediatrician:    Sara Lee  Pediatrician List:   [employer] [place]    [place] Other ([facility]  [facility]      Pediatrician Fax Number:    Risk Factors/Current Problems:  None, Substance Use    Cognitive State:  Able to Concentrate , Alert , Insightful    Mood/Affect:  Relaxed , Comfortable , Calm , Interested , Happy    CSW Assessment: CSW consulted as MOB had THC use in  pregnancy. CSW went to speak with MOB at bedside to address further needs.   CSW congratulated MOB and FOB on the birth of infant. CSW advised MOB of of the HIPPA policy in which MOB was agreeable to having FOB leave as CSW spoke with her. CSW then advised MOB of CSW's role and the reason for CSW coming to visit with her. MOB expressed that she did use THC in her pregnancy. MOB expressed that she use due to "I wasn't abel to sleep, eat and I was sick". CSW understanding and asked MOB when her last use was. MOB reported that she last used [facility] in August 2021. CSW advised MOB of the hospital drug screen policy. MOB was advised that infants UDS is positive for THC, therefore CSW would need to make a CPS report. MOB expressed that she didn't use any other substances while pregnant and asked "so what is there process for this". CSW advised MOB That CSW would make report and then CPS would either follow up with her here at the hospital or once arrived home. CSW did advised MOB that usually CPS will get in contact with MOB to gather further information  is needed. MOB reported that she understood and expressed no other questions. CSW finished explaining to MOB if results of CDS is positive for any other substances that MOB wasn't given or prescribed by MD then CSW would need to make a new CPS report. MOB reported no further questions.   CSW inquired from MOB on her mental health. MOB expressed that she has never been diagnosed with anything but does report that she has dealt with depression and anxiety in the past. MOB expressed that she has never taken any mediation for this. MOB expressed that she was in therapy in the past, however reported no desire for resources at this time. MOB reported that she has been feeling pretty good since giving birth and denies SI, HI and DV to this CSW.   CSW took time to provide MOB with PPD and SIDS education. MOB was given PPD Checklist in order to keep track of feelings as they  relate to PPD. MOB thanked CSW. CSW was notified that MOB would have infant sleep in basinet once arrived home. MOB reported that infant would be seen at [facility] for further follow up care.   CSW made report to [place] CPS for infant positive UDS, however CSW updated by Y. Tinsley with [place] CPS that this is not their county and that CSW should make report to [place]. CSW will make new report to [place] for infants positive UDS THC.   CSW Plan/Description:  Sudden Infant Death Syndrome (SIDS) Education, Perinatal Mood and Anxiety Disorder (PMADs) Education, Hospital Drug Screen Policy Information, Child Protective Service Report , CSW Awaiting CPS Disposition Plan, No Further Intervention Required/No Barriers to Discharge    Nakita Santerre S Jakeya Gherardi, LCSWA 10/19/2019, 1:34 PM  

## 2019-10-20 LAB — RH IG WORKUP (INCLUDES ABO/RH)
ABO/RH(D): O NEG
Fetal Screen: NEGATIVE
Gestational Age(Wks): 37
Unit division: 0

## 2019-10-20 MED ORDER — MEASLES, MUMPS & RUBELLA VAC IJ SOLR
0.5000 mL | Freq: Once | INTRAMUSCULAR | Status: AC
  Administered 2019-10-21: 0.5 mL via SUBCUTANEOUS
  Filled 2019-10-20: qty 0.5

## 2019-10-20 NOTE — Progress Notes (Addendum)
POSTPARTUM PROGRESS NOTE  Subjective: Colleen Welch is a 19 y.o. G1P1001 s/p SVD at [redacted]w[redacted]d for SOL.  She reports she doing well. No acute events overnight. She denies any problems with ambulating, voiding or po intake. Denies nausea or vomiting. She has passed flatus. Pain is well controlled.  Lochia is consistent but no clots.  Objective: Blood pressure 125/79, pulse 76, temperature 98 F (36.7 C), temperature source Oral, resp. rate 17, height 5\' 5"  (1.651 m), weight 78.5 kg, last menstrual period 01/27/2019, SpO2 99 %, unknown if currently breastfeeding.  Physical Exam:  General: alert, cooperative and no distress Chest: no respiratory distress, SOB or chest pain Abdomen: soft, non-tender  Uterine Fundus: firm and at level of umbilicus Extremities: No calf swelling or tenderness  no edema  Recent Labs    10/19/19 0814  HGB 10.9*  HCT 32.4*    Assessment/Plan: Colleen Welch is a 19 y.o. G1P1001 PPD#1 s/p SVD at [redacted]w[redacted]d for SOL.  Routine Postpartum Care: Doing well, pain well-controlled.  -- Continue routine care, lactation support  -- Contraception: POPs -- Feeding: Breast feeding -- Baby boy received circumcision 9/14  Dispo: Plan for discharge tomorrow. Peds team would like to observe baby x48h due to incomplete penicillin administration prior to delivery for GBS+.  10/14 10/20/2019 8:15 AM   I saw and evaluated the patient. I agree with the findings and the plan of care as documented in the student's note. I discussed Covid Vaccine with patient and her partner however she declines at this time.   COVID-19 Vaccine Counseling: The patient was counseled on the potential benefits and lack of known risks of COVID vaccination, during pregnancy and breastfeeding, during today's visit. The patient's questions and concerns were addressed today, including safety of the vaccination and potential side effects as they have been published by ACOG and SMFM. The  patient has been informed that there have not been any documented vaccine related injuries, deaths or birth defects to infant or mom after receiving the COVID-19 vaccine to date.  All patient questions were addressed during our discussion today. The patient is not planning to get vaccinated at this time.    10/22/2019, MD [facility] Family Medicine Fellow, [facility] for [facility], [facility]

## 2019-10-20 NOTE — Lactation Note (Addendum)
This note was copied from a baby's chart. Lactation Consultation Note  Patient Name: Colleen Welch RDEYC'X Date: 10/20/2019    F/u visit at 37 hrs of life. Mom's breasts feel fuller today. Mom reports she has been leaking colostrum for some time. Swallows are audible to the naked ear. I noted that sometimes the swallows were not frequent & I helped Mom to latch infant on more deeply. I also noted that breast compressions helped to increase swallowing frequency, which Mom was able to do. Mom was taught to remove breast compression if the number of swallows seemed too fast/lengthy.  I noted that in between sucking & swallowing bursts, infant's RR was increased with very mild (abdominal) retractions noted. I placed Mom in side-lying position to see if change in position/flow of colostrum would ameliorate that, but it still continued. Infant seemed well-appearing otherwise (no  grunting or nasal flaring was noted). Infant's feeding did not seem impeded.   I notified nurse Kristine Linea, RN who came into room and I notified Dr. Sherryll Burger as well (at 1517).   1535: I reentered room. Infant had recently unlatched. Infant was on Mom. I noted an increased respiratory rate & mild abdominal retractions. I notified Bethann Humble, NP and requested she come see. I made Mom aware.   1548: Clydie Braun, RN made aware of my notification of NP.   Other: Mom has a mild compression stripe on her R nipple, but not her L nipple. I reminded Mom to get infant to latch with bottom lip touching first.   Lurline Hare [facility] 10/20/2019, 3:03 PM

## 2019-10-20 NOTE — Progress Notes (Signed)
CSW updated by Sheppard Coil with [facility] CPS that there are no barriers to infant discharging home with MOB.   Claude Manges Samon Dishner, MSW, LCSW [facility] [place] [phone]

## 2019-10-21 MED ORDER — ACETAMINOPHEN 325 MG PO TABS: 650.0000 mg | ORAL_TABLET | Freq: Four times a day (QID) | ORAL | Status: AC | PRN

## 2019-10-21 MED ORDER — COCONUT OIL OIL: 1.0000 "application " | TOPICAL_OIL | 0 refills | Status: AC | PRN

## 2019-10-21 MED ORDER — IBUPROFEN 600 MG PO TABS: 600.0000 mg | ORAL_TABLET | Freq: Three times a day (TID) | ORAL | 0 refills | Status: AC | PRN

## 2019-10-21 MED ORDER — NORETHINDRONE 0.35 MG PO TABS: 1.0000 | ORAL_TABLET | Freq: Every day | ORAL | 10 refills | Status: AC

## 2019-10-21 NOTE — Lactation Note (Signed)
This note was copied from a baby's chart. Lactation Consultation Note  Patient Name: Colleen Welch EHOZY'Y Date: 10/21/2019 Reason for consult: Follow-up assessment   Baby 55 hours old.  Baby alert and showing cues. Mother latched baby in side lying position.  Turned infant tummy to tummy. Observed frequent swallows.  Praised mother for her efforts. Reviewed engorgement care and monitoring voids/stools. Feed on demand with cues.  Goal 8-12+ times per day after first 24 hrs.  Place baby STS if not cueing.     Maternal Data    Feeding Feeding Type: Breast Fed  LATCH Score Latch: Grasps breast easily, tongue down, lips flanged, rhythmical sucking.  Audible Swallowing: Spontaneous and intermittent  Type of Nipple: Everted at rest and after stimulation  Comfort (Breast/Nipple): Soft / non-tender  Hold (Positioning): Assistance needed to correctly position infant at breast and maintain latch.  LATCH Score: 9  Interventions Interventions: Breast feeding basics reviewed;Assisted with latch  Lactation Tools Discussed/Used     Consult Status Consult Status: Complete Date: 10/21/19    [name] [facility] 10/21/2019, 9:04 AM

## 2019-11-16 ENCOUNTER — Ambulatory Visit: Payer: Medicaid Other | Admitting: Women's Health

## 2022-03-06 DIAGNOSIS — Z113 Encounter for screening for infections with a predominantly sexual mode of transmission: Secondary | ICD-10-CM | POA: Diagnosis not present
